# Patient Record
Sex: Male | Born: 2000 | Race: White | Hispanic: No | Marital: Single | Smoking: Never smoker
Health system: Southern US, Community
[De-identification: ages and names within clinical notes are randomized; demographics above are authoritative.]

## PROBLEM LIST (undated history)

## (undated) DIAGNOSIS — F419 Anxiety disorder, unspecified: Secondary | ICD-10-CM

## (undated) DIAGNOSIS — F902 Attention-deficit hyperactivity disorder, combined type: Secondary | ICD-10-CM

## (undated) DIAGNOSIS — R4689 Other symptoms and signs involving appearance and behavior: Secondary | ICD-10-CM

## (undated) DIAGNOSIS — F3481 Disruptive mood dysregulation disorder: Secondary | ICD-10-CM

## (undated) DIAGNOSIS — F4325 Adjustment disorder with mixed disturbance of emotions and conduct: Secondary | ICD-10-CM

## (undated) DIAGNOSIS — F840 Autistic disorder: Secondary | ICD-10-CM

## (undated) HISTORY — DX: Adjustment disorder with mixed disturbance of emotions and conduct: F43.25

## (undated) HISTORY — DX: Attention-deficit hyperactivity disorder, combined type: F90.2

## (undated) HISTORY — DX: Other symptoms and signs involving appearance and behavior: R46.89

---
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
## 2021-01-27 ENCOUNTER — Other Ambulatory Visit: Payer: Self-pay

## 2021-01-27 ENCOUNTER — Ambulatory Visit (HOSPITAL_COMMUNITY)
Admission: EM | Admit: 2021-01-27 | Discharge: 2021-01-27 | Disposition: A | Discharge status: Home / Self Care | Payer: No Typology Code available for payment source | Attending: Urology | Admitting: Urology

## 2021-01-27 DIAGNOSIS — F840 Autistic disorder: Secondary | ICD-10-CM | POA: Insufficient documentation

## 2021-01-27 DIAGNOSIS — Z6222 Institutional upbringing: Secondary | ICD-10-CM | POA: Insufficient documentation

## 2021-01-27 DIAGNOSIS — Z733 Stress, not elsewhere classified: Secondary | ICD-10-CM | POA: Insufficient documentation

## 2021-01-27 DIAGNOSIS — F909 Attention-deficit hyperactivity disorder, unspecified type: Secondary | ICD-10-CM | POA: Insufficient documentation
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
## 2021-01-27 NOTE — Discharge Instructions (Addendum)
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
## 2021-01-31 ENCOUNTER — Other Ambulatory Visit: Payer: Self-pay

## 2021-01-31 MED ORDER — CETIRIZINE HCL 10 MG PO TABS: 10.0000 mg | ORAL_TABLET | Freq: Every day | ORAL | 5 refills | Status: DC

## 2021-03-06 ENCOUNTER — Ambulatory Visit (HOSPITAL_COMMUNITY)
Admission: EM | Admit: 2021-03-06 | Discharge: 2021-03-06 | Disposition: A | Discharge status: Home / Self Care | Payer: Medicaid Other | Attending: Physician Assistant | Admitting: Physician Assistant

## 2021-03-06 ENCOUNTER — Encounter (HOSPITAL_COMMUNITY): Payer: Self-pay | Admitting: Emergency Medicine

## 2021-03-06 DIAGNOSIS — W503XXA Accidental bite by another person, initial encounter: Secondary | ICD-10-CM

## 2021-03-06 DIAGNOSIS — S0081XA Abrasion of other part of head, initial encounter: Secondary | ICD-10-CM

## 2021-03-06 HISTORY — DX: Disruptive mood dysregulation disorder: F34.81

## 2021-03-06 HISTORY — DX: Anxiety disorder, unspecified: F41.9

## 2021-03-06 HISTORY — DX: Autistic disorder: F84.0

## 2021-03-06 MED ORDER — DOXYCYCLINE HYCLATE 100 MG PO CAPS: 100.0000 mg | ORAL_CAPSULE | Freq: Two times a day (BID) | ORAL | 0 refills | Status: DC
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
## 2021-03-06 NOTE — Discharge Instructions (Addendum)
Continue to keep wound clean with soap and water. If redness, swelling worsens, or he develops fever, nausea or vomiting recommend evaluation in the ED.
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
## 2021-03-07 ENCOUNTER — Other Ambulatory Visit: Payer: Self-pay | Admitting: Family Medicine

## 2021-03-08 ENCOUNTER — Other Ambulatory Visit: Payer: Self-pay | Admitting: *Deleted

## 2021-03-08 MED ORDER — FLUTICASONE PROPIONATE 50 MCG/ACT NA SUSP: 2.0000 | Freq: Every day | NASAL | 3 refills | Status: DC

## 2021-05-03 ENCOUNTER — Other Ambulatory Visit: Payer: Self-pay | Admitting: *Deleted

## 2021-05-04 MED ORDER — VITAMIN D3 25 MCG PO TABS: 1000.0000 [IU] | ORAL_TABLET | Freq: Every day | ORAL | 0 refills | Status: DC

## 2021-05-14 ENCOUNTER — Other Ambulatory Visit: Payer: Self-pay | Admitting: Family Medicine

## 2021-06-03 ENCOUNTER — Other Ambulatory Visit: Payer: Self-pay | Admitting: *Deleted

## 2021-06-03 MED ORDER — VITAMIN D3 25 MCG PO TABS: 1000.0000 [IU] | ORAL_TABLET | Freq: Every day | ORAL | 3 refills | Status: DC

## 2021-07-02 ENCOUNTER — Other Ambulatory Visit: Payer: Self-pay | Admitting: *Deleted

## 2021-07-02 MED ORDER — FLUTICASONE PROPIONATE 50 MCG/ACT NA SUSP: 2.0000 | Freq: Every day | NASAL | 3 refills | Status: DC

## 2021-07-02 MED ORDER — MELATONIN 3 MG PO TABS: 3.0000 mg | ORAL_TABLET | Freq: Every day | ORAL | 2 refills | Status: DC

## 2021-08-01 ENCOUNTER — Other Ambulatory Visit: Payer: Self-pay

## 2021-08-01 MED ORDER — DOCUSATE SODIUM 100 MG PO CAPS: 100.0000 mg | ORAL_CAPSULE | Freq: Every day | ORAL | 0 refills | Status: DC

## 2021-08-01 MED ORDER — CETIRIZINE HCL 10 MG PO TABS: 10.0000 mg | ORAL_TABLET | Freq: Every day | ORAL | 1 refills | Status: DC

## 2021-08-03 ENCOUNTER — Other Ambulatory Visit: Payer: Self-pay

## 2021-08-03 ENCOUNTER — Emergency Department (HOSPITAL_COMMUNITY)
Admission: EM | Admit: 2021-08-03 | Discharge: 2021-08-04 | Disposition: A | Discharge status: Home / Self Care | Payer: BC Managed Care – PPO | Attending: Emergency Medicine | Admitting: Emergency Medicine

## 2021-08-03 DIAGNOSIS — R454 Irritability and anger: Secondary | ICD-10-CM | POA: Insufficient documentation

## 2021-08-03 DIAGNOSIS — Z046 Encounter for general psychiatric examination, requested by authority: Secondary | ICD-10-CM | POA: Diagnosis present

## 2021-08-03 DIAGNOSIS — Z20822 Contact with and (suspected) exposure to covid-19: Secondary | ICD-10-CM | POA: Diagnosis not present

## 2021-08-03 DIAGNOSIS — R451 Restlessness and agitation: Secondary | ICD-10-CM

## 2021-08-03 DIAGNOSIS — R4689 Other symptoms and signs involving appearance and behavior: Secondary | ICD-10-CM

## 2021-08-03 DIAGNOSIS — R531 Weakness: Secondary | ICD-10-CM | POA: Insufficient documentation

## 2021-08-03 DIAGNOSIS — Z5989 Other problems related to housing and economic circumstances: Secondary | ICD-10-CM | POA: Insufficient documentation

## 2021-08-03 DIAGNOSIS — G3184 Mild cognitive impairment, so stated: Secondary | ICD-10-CM | POA: Insufficient documentation

## 2021-08-03 DIAGNOSIS — F840 Autistic disorder: Secondary | ICD-10-CM

## 2021-08-03 DIAGNOSIS — F918 Other conduct disorders: Secondary | ICD-10-CM | POA: Diagnosis not present

## 2021-08-03 DIAGNOSIS — F29 Unspecified psychosis not due to a substance or known physiological condition: Secondary | ICD-10-CM | POA: Diagnosis not present

## 2021-08-03 DIAGNOSIS — F909 Attention-deficit hyperactivity disorder, unspecified type: Secondary | ICD-10-CM | POA: Diagnosis not present

## 2021-08-03 DIAGNOSIS — Z79899 Other long term (current) drug therapy: Secondary | ICD-10-CM | POA: Insufficient documentation

## 2021-08-03 DIAGNOSIS — F4325 Adjustment disorder with mixed disturbance of emotions and conduct: Secondary | ICD-10-CM | POA: Insufficient documentation

## 2021-08-03 DIAGNOSIS — F902 Attention-deficit hyperactivity disorder, combined type: Secondary | ICD-10-CM | POA: Insufficient documentation

## 2021-08-03 DIAGNOSIS — R441 Visual hallucinations: Secondary | ICD-10-CM | POA: Insufficient documentation

## 2021-08-03 LAB — COMPREHENSIVE METABOLIC PANEL
ALT: 50 U/L — ABNORMAL HIGH (ref 0–44)
AST: 27 U/L (ref 15–41)
Albumin: 4.7 g/dL (ref 3.5–5.0)
Alkaline Phosphatase: 77 U/L (ref 38–126)
Anion gap: 9 (ref 5–15)
BUN: 18 mg/dL (ref 6–20)
CO2: 23 mmol/L (ref 22–32)
Calcium: 9.4 mg/dL (ref 8.9–10.3)
Chloride: 103 mmol/L (ref 98–111)
Creatinine, Ser: 0.95 mg/dL (ref 0.61–1.24)
GFR, Estimated: >60 mL/min (ref >60)
Glucose, Bld: 91 mg/dL (ref 70–99)
Potassium: 3.9 mmol/L (ref 3.5–5.1)
Sodium: 135 mmol/L (ref 135–145)
Total Bilirubin: 1.1 mg/dL (ref 0.3–1.2)
Total Protein: 7.9 g/dL (ref 6.5–8.1)

## 2021-08-03 LAB — RAPID URINE DRUG SCREEN, HOSP PERFORMED
Amphetamines: NOT DETECTED
Barbiturates: NOT DETECTED
Benzodiazepines: NOT DETECTED
Cocaine: NOT DETECTED
Opiates: NOT DETECTED
Tetrahydrocannabinol: NOT DETECTED

## 2021-08-03 LAB — CBC WITH DIFFERENTIAL/PLATELET
Abs Immature Granulocytes: 0.02 10*3/uL (ref 0.00–0.07)
Basophils Absolute: 0 10*3/uL (ref 0.0–0.1)
Basophils Relative: 1 %
Eosinophils Absolute: 0.1 10*3/uL (ref 0.0–0.5)
Eosinophils Relative: 2 %
HCT: 55.5 % — ABNORMAL HIGH (ref 39.0–52.0)
Hemoglobin: 19 g/dL — ABNORMAL HIGH (ref 13.0–17.0)
Immature Granulocytes: 0 %
Lymphocytes Relative: 33 %
Lymphs Abs: 2.2 10*3/uL (ref 0.7–4.0)
MCH: 30 pg (ref 26.0–34.0)
MCHC: 34.2 g/dL (ref 30.0–36.0)
MCV: 87.7 fL (ref 80.0–100.0)
Monocytes Absolute: 0.6 10*3/uL (ref 0.1–1.0)
Monocytes Relative: 9 %
Neutro Abs: 3.7 10*3/uL (ref 1.7–7.7)
Neutrophils Relative %: 55 %
Platelets: 235 10*3/uL (ref 150–400)
RBC: 6.33 MIL/uL — ABNORMAL HIGH (ref 4.22–5.81)
RDW: 12.9 % (ref 11.5–15.5)
WBC: 6.6 10*3/uL (ref 4.0–10.5)
nRBC: 0 % (ref 0.0–0.2)

## 2021-08-03 LAB — ACETAMINOPHEN LEVEL: Acetaminophen (Tylenol), Serum: <10 ug/mL — ABNORMAL LOW (ref 10–30)

## 2021-08-03 LAB — SALICYLATE LEVEL: Salicylate Lvl: <7 mg/dL — ABNORMAL LOW (ref 7.0–30.0)

## 2021-08-03 LAB — ETHANOL: Alcohol, Ethyl (B): <10 mg/dL (ref <10)

## 2021-08-03 MED ORDER — CHLORPROMAZINE HCL 25 MG PO TABS: 25.0000 mg | ORAL_TABLET | Freq: Three times a day (TID) | ORAL | Status: DC

## 2021-08-03 MED ORDER — STERILE WATER FOR INJECTION IJ SOLN
INTRAMUSCULAR | Status: AC
  Administered 2021-08-03: 1.2 mL
  Filled 2021-08-03: qty 10

## 2021-08-03 MED ORDER — ATOMOXETINE HCL 40 MG PO CAPS
40.0000 mg | ORAL_CAPSULE | Freq: Every day | ORAL | Status: DC
  Filled 2021-08-03: qty 1

## 2021-08-03 MED ORDER — MELATONIN 3 MG PO TABS: 3.0000 mg | ORAL_TABLET | Freq: Every day | ORAL | Status: DC

## 2021-08-03 MED ORDER — ONDANSETRON 4 MG PO TBDP
ORAL_TABLET | ORAL | Status: AC
  Filled 2021-08-03: qty 1

## 2021-08-03 MED ORDER — LORAZEPAM 2 MG/ML IJ SOLN
2.0000 mg | Freq: Once | INTRAMUSCULAR | Status: AC
  Administered 2021-08-03: 2 mg via INTRAVENOUS
  Filled 2021-08-03: qty 1

## 2021-08-03 MED ORDER — HYDROXYZINE HCL 50 MG/ML IM SOLN
100.0000 mg | Freq: Four times a day (QID) | INTRAMUSCULAR | Status: DC | PRN
  Administered 2021-08-03: 100 mg via INTRAMUSCULAR
  Filled 2021-08-03: qty 2

## 2021-08-03 MED ORDER — ONDANSETRON 4 MG PO TBDP
4.0000 mg | ORAL_TABLET | Freq: Once | ORAL | Status: AC
  Administered 2021-08-03: 4 mg via ORAL

## 2021-08-03 MED ORDER — BUSPIRONE HCL 10 MG PO TABS: 15.0000 mg | ORAL_TABLET | Freq: Three times a day (TID) | ORAL | Status: DC

## 2021-08-03 MED ORDER — VITAMIN D3 25 MCG PO TABS
1000.0000 [IU] | ORAL_TABLET | Freq: Every day | ORAL | Status: DC
  Filled 2021-08-03: qty 1

## 2021-08-03 MED ORDER — KETAMINE HCL 50 MG/ML IJ SOLN
300.0000 mg | Freq: Once | INTRAMUSCULAR | Status: AC
  Administered 2021-08-03: 300 mg via INTRAMUSCULAR
  Filled 2021-08-03: qty 10

## 2021-08-03 MED ORDER — LORATADINE 10 MG PO TABS: 10.0000 mg | ORAL_TABLET | Freq: Every day | ORAL | Status: DC

## 2021-08-03 MED ORDER — ZIPRASIDONE MESYLATE 20 MG IM SOLR: 20.0000 mg | Freq: Once | INTRAMUSCULAR | Status: AC

## 2021-08-03 MED ORDER — ZIPRASIDONE MESYLATE 20 MG IM SOLR
INTRAMUSCULAR | Status: AC
  Administered 2021-08-03: 20 mg via INTRAMUSCULAR
  Filled 2021-08-03: qty 20

## 2021-08-03 NOTE — ED Notes (Signed)
Pt placed in room, sitter present, placed on monitor

## 2021-08-03 NOTE — ED Provider Notes (Signed)
[facility] DEPT Provider Note   CSN: [id number] Arrival date & time: 08/03/21  0857     History Chief Complaint  Patient presents with   Mental Health Problem    Ray Carter is a 21 y.o. male with hx of ADHD and autism who presents to the emergency department under police IVC order secondary to destruction of property at his group home.  Patient states that he wanted to destroyed the property and started ripping out the metal fence and was trying to rip off cameras off the house.  When cops arrived, patient also asked if he could destroy their vehicles as well.  He denies any suicidal homicidal ideations.  No auditory or visual hallucinations.   Mental Health Problem     Home Medications Prior to Admission medications   Medication Sig Start Date End Date Taking? Authorizing Provider  atomoxetine (STRATTERA) 40 MG capsule Take 40 mg by mouth daily.    [provider]  busPIRone (BUSPAR) 10 MG tablet Take 15 mg by mouth 3 (three) times daily.    [provider]  cetirizine (ZYRTEC) 10 MG tablet Take 1 tablet (10 mg total) by mouth daily. Please make appointment for further refills. 08/01/21   Wells Guiles, DO  chlorproMAZINE (THORAZINE) 25 MG tablet Take 25 mg by mouth 3 (three) times daily. Take 25mg  8am and 2pm, 75mg  at 8pm.    [provider]  docusate sodium (STOOL SOFTENER) 100 MG capsule Take 1 capsule (100 mg total) by mouth daily. 08/01/21   Wells Guiles, DO  doxycycline (VIBRAMYCIN) 100 MG capsule Take 1 capsule (100 mg total) by mouth 2 (two) times daily. 03/06/21   Francene Finders, PA-C  fluticasone (FLONASE) 50 MCG/ACT nasal spray Place 2 sprays into both nostrils daily. 07/02/21   Wells Guiles, DO  fluvoxaMINE (LUVOX) 100 MG tablet Take 100 mg by mouth 2 (two) times daily.    [provider]  melatonin 3 MG TABS tablet Take 1 tablet (3 mg total) by mouth at bedtime. 07/02/21   Wells Guiles, DO  Vitamin D3  (VITAMIN D) 25 MCG tablet Take 1 tablet (1,000 Units total) by mouth daily. 06/03/21   Wells Guiles, DO      Allergies    Patient has no known allergies.    Review of Systems   Review of Systems  All other systems reviewed and are negative.  Physical Exam Updated Vital Signs There were no vitals taken for this visit. Physical Exam Vitals and nursing note reviewed.  Constitutional:      General: He is not in acute distress.    Appearance: Normal appearance.  HENT:     Head: Normocephalic and atraumatic.  Eyes:     General:        Right eye: No discharge.        Left eye: No discharge.  Cardiovascular:     Comments: Regular rate and rhythm.  S1/S2 are distinct without any evidence of murmur, rubs, or gallops.  Radial pulses are 2+ bilaterally.  Dorsalis pedis pulses are 2+ bilaterally.  No evidence of pedal edema. Pulmonary:     Comments: Clear to auscultation bilaterally.  Normal effort.  No respiratory distress.  No evidence of wheezes, rales, or rhonchi heard throughout. Abdominal:     General: Abdomen is flat. Bowel sounds are normal. There is no distension.     Tenderness: There is no abdominal tenderness. There is no guarding or rebound.  Musculoskeletal:  General: Normal range of motion.     Cervical back: Neck supple.  Skin:    General: Skin is warm and dry.     Findings: No rash.  Neurological:     General: No focal deficit present.     Mental Status: He is alert.  Psychiatric:        Mood and Affect: Mood normal.        Behavior: Behavior normal.    ED Results / Procedures / Treatments   Labs (all labs ordered are listed, but only abnormal results are displayed) Labs Reviewed - No data to display  EKG None  Radiology No results found.  Procedures Procedures    Medications Ordered in ED Medications - No data to display  ED Course/ Medical Decision Making/ A&P                           Medical Decision Making Amount and/or Complexity of  Data Reviewed Labs: ordered.   Ray Carter is a 21 y.o. male with significant morbidities to impact his care today to include ADHD and autism who presents to the emergency department today under IVC order secondary destruction of property.  Given the history I do believe he would benefit from psychiatric evaluation today.  He is in no acute distress and has no real somatic complaints.  Appropriate paperwork was obtained and correctly documented.  I will get medical screening labs to clear him and plan to consult TTS.  I personally reviewed the labs.  CBC is without any evidence of leukocytosis.  CMP was normal.  Tylenol and salicylate levels were normal.  Urine drug screen is pending.  Respiratory panel is pending.  Patient is medically cleared from my standpoint.  I will place patient psychiatric hold and put a call for TTS for further recommendations.  Disposition to be made by oncoming provider with TTS recommendations.  Final Clinical Impression(s) / ED Diagnoses Final diagnoses:  None    Rx / DC Orders ED Discharge Orders     None         Hendricks Limes, [place] 08/03/21 1503    Valarie Merino, MD 08/05/21 [phone]

## 2021-08-03 NOTE — ED Triage Notes (Signed)
Pt arrives via [employer] from group home with reports of destructive behavior.
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
## 2021-08-03 NOTE — ED Notes (Signed)
Pt pacing in hallway, stating he his a "White supremacist", threatening to walk out and saying "fuck EMS"; attempt made to redirect pt to bed
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
## 2021-08-03 NOTE — ED Notes (Addendum)
This writer told pt to have a seat. Pt not cooperating. Pt insisting to get his flag and the rest of his belongings. Pt states "when I see them (security) I'm going to run". This Software engineer.

## 2021-08-03 NOTE — ED Notes (Addendum)
Pt continues to pace, threatening to walk out and curse in hallway; multiple attempts made by staff to redirect pt, pt continues to threaten to walk out and is argumentative

## 2021-08-03 NOTE — BH Assessment (Addendum)
Disposition:   Per Oneida Alar, NP pt does not meet inpatient criteria and can be discharged back to group home.   @ 2256, Clinician attempted to make contact with group home due to staff/TOC not being able to contact group home.   First, called [employer] (group home starff) [phone], no answer, phone rung multiple times, no option to leave a voicemail.   Second, called Lovette Cliche (group home owner) [phone], he answered and was informed that patient was psych cleared earlier today and ready to be picked up from the ED. Patient to follow up with current outpatient provider [facility]).   Quillian Quince stated, no staff were available to pick patient up tonight from the ED. He says,  "All my staff are sleep and it's no one to pick him up, staff have to sleep". Clinician reminded the group home owner that that patient was psych cleared earlier today and it's no longer reason to continue holding him in the Emergency Department. Disposition Counselor attempted to seek another resolution with the group home owner such as him having another staff member pick patient up from the ED. Group Home owner refused, stating, "No, No, My staff are sleep, it's not additional staff, and I don't want to wake my current staff up". The group home owner refused to take patient back into the group home until morning.   Quillian Quince states, "It's best to have [employer] drop him off at the group home in the morning @ 7:30am". Clinician asked if their were any  group home staff available in the morning to assume care. Quillian Quince stated, "Yes, her name is  Mrs. Laquita Humphrey # [id number] [id number]". Disposition Counselor reviewed the plan with the group home owner and that his request for [employer] transport would be relayed to the Emergency Department staff.   Charge nurse reached out to this Disposition Counselor regarding transportation back to the group home. Disposition Counselor suggested for TOC to be contacted to assist in  coordinating patient's return back to his group home, as patient is psych cleared.  Quillian Quince confirmed that patient is his own legal guardian.Therefore, their is no guardian to contact in this case.   Patient's nurse Candelaria Stagers, RN) and EDP (Dr. Eulis Foster) provided disposition updates.

## 2021-08-03 NOTE — ED Notes (Signed)
Pt has been seen and wand by security.  Two bags in cabinet label C.

## 2021-08-03 NOTE — ED Notes (Signed)
Pt placed in soft restraints in an attempt to avoid hard restraints

## 2021-08-03 NOTE — ED Notes (Addendum)
Pt attempts to walk off unit, security called, EDP and charge RN aware

## 2021-08-03 NOTE — TOC Initial Note (Signed)
Transition of Care [facility]) - Initial/Assessment Note    Patient Details  Name: Ray Carter MRN: [id number] Date of Birth: May 23, 2001  Transition of [facility]) CM/SW Contact:    Lockie Pares, RN Phone Number: 08/03/2021, 6:31 PM  Clinical Narrative:                  TOC consulted due to not being able to contact group home. Called first number [facility], no answer, no message could be left. [phone] Called second number, Alonza Smoker [phone], no answer left message for him to return my call ASAP.        Patient Goals and CMS Choice        Expected Discharge Plan and Services                                                Prior Living Arrangements/Services                       Activities of Daily Living      Permission Sought/Granted                  Emotional Assessment              Admission diagnosis:  MEntal Health Eval Patient Active Problem List   Diagnosis Date Noted   Attention deficit hyperactivity disorder (ADHD), combined type    Adjustment disorder with mixed disturbance of emotions and conduct    Overweight 07/15/2020   Polycythemia 07/15/2020   Mild cognitive impairment 07/13/2020   Psychiatric disorder 07/13/2020   Hypersomnolence 12/13/2019   PCP:  Shelby Mattocks, DO Pharmacy:   [facility] - [place], [place] - [address] [place] [address] Phone: [phone] Fax: [phone]     Social Determinants of Health (SDOH) Interventions    Readmission Risk Interventions No flowsheet data found.

## 2021-08-03 NOTE — ED Notes (Signed)
Pt eating at this time.

## 2021-08-03 NOTE — ED Provider Notes (Signed)
He has persistent agitation, despite multiple doses of sedating medication, requiring frequent and firm restraint, by multiple providers including restraint devices.  He is unsafe and a danger to himself and others.  So far this evening he has had Geodon, Ativan, and hydroxyzine all IM.  Will give additional IM dose of ketamine 300 mg.  Patient will require monitoring.  10:40 PM-patient now calm, not requiring security at bedside.  He is responsive and complains of sleepiness.  Asked if he wanted to go to sleep and he stated "no."  Vital signs are stable.  Oxygenation is normal.  Heart rate 85, sinus, on cardiac monitor.  Pulse oxygenation 95% on room air.  Will continue to observe.  Note that legal guardian and group home had not responded to calls to retrieve patient.  TOC has been consulted.  No resolution for disposition, yet.   Daleen Bo, MD 08/03/21 2245

## 2021-08-03 NOTE — ED Notes (Signed)
Pt transferred from [place] [facility] to [place] [facility]. Pt's belonging's bags remain behind nursing station 9-12.

## 2021-08-03 NOTE — ED Notes (Signed)
Security called

## 2021-08-04 ENCOUNTER — Ambulatory Visit (HOSPITAL_COMMUNITY)
Admission: EM | Admit: 2021-08-04 | Discharge: 2021-08-04 | Disposition: A | Discharge status: Home / Self Care | Payer: BC Managed Care – PPO | Source: Home / Self Care

## 2021-08-04 ENCOUNTER — Other Ambulatory Visit: Payer: Self-pay

## 2021-08-04 ENCOUNTER — Emergency Department (HOSPITAL_COMMUNITY)
Admission: EM | Admit: 2021-08-04 | Discharge: 2021-08-05 | Disposition: A | Discharge status: Home / Self Care | Payer: BC Managed Care – PPO | Source: Home / Self Care | Attending: Emergency Medicine | Admitting: Emergency Medicine

## 2021-08-04 DIAGNOSIS — F918 Other conduct disorders: Secondary | ICD-10-CM | POA: Insufficient documentation

## 2021-08-04 DIAGNOSIS — R531 Weakness: Secondary | ICD-10-CM | POA: Insufficient documentation

## 2021-08-04 DIAGNOSIS — F4329 Adjustment disorder with other symptoms: Secondary | ICD-10-CM | POA: Insufficient documentation

## 2021-08-04 DIAGNOSIS — F840 Autistic disorder: Secondary | ICD-10-CM | POA: Insufficient documentation

## 2021-08-04 DIAGNOSIS — F29 Unspecified psychosis not due to a substance or known physiological condition: Secondary | ICD-10-CM | POA: Insufficient documentation

## 2021-08-04 DIAGNOSIS — F909 Attention-deficit hyperactivity disorder, unspecified type: Secondary | ICD-10-CM | POA: Insufficient documentation

## 2021-08-04 DIAGNOSIS — R441 Visual hallucinations: Secondary | ICD-10-CM | POA: Insufficient documentation

## 2021-08-04 DIAGNOSIS — Z5989 Other problems related to housing and economic circumstances: Secondary | ICD-10-CM | POA: Insufficient documentation

## 2021-08-04 DIAGNOSIS — R4689 Other symptoms and signs involving appearance and behavior: Secondary | ICD-10-CM

## 2021-08-04 DIAGNOSIS — G3184 Mild cognitive impairment, so stated: Secondary | ICD-10-CM | POA: Insufficient documentation

## 2021-08-04 DIAGNOSIS — R454 Irritability and anger: Secondary | ICD-10-CM | POA: Insufficient documentation

## 2021-08-04 DIAGNOSIS — Z20822 Contact with and (suspected) exposure to covid-19: Secondary | ICD-10-CM | POA: Insufficient documentation

## 2021-08-04 DIAGNOSIS — F4325 Adjustment disorder with mixed disturbance of emotions and conduct: Secondary | ICD-10-CM | POA: Insufficient documentation

## 2021-08-04 DIAGNOSIS — Z7689 Persons encountering health services in other specified circumstances: Secondary | ICD-10-CM

## 2021-08-04 LAB — RESP PANEL BY RT-PCR (FLU A&B, COVID) ARPGX2
Influenza A by PCR: NEGATIVE
Influenza B by PCR: NEGATIVE
SARS Coronavirus 2 by RT PCR: NEGATIVE

## 2021-08-04 MED ORDER — ZIPRASIDONE MESYLATE 20 MG IM SOLR
20.0000 mg | INTRAMUSCULAR | Status: AC | PRN
  Administered 2021-08-04: 20 mg via INTRAMUSCULAR
  Filled 2021-08-04: qty 20

## 2021-08-04 MED ORDER — DOCUSATE SODIUM 100 MG PO CAPS
100.0000 mg | ORAL_CAPSULE | Freq: Every day | ORAL | Status: DC
  Administered 2021-08-05: 100 mg via ORAL
  Filled 2021-08-04: qty 1

## 2021-08-04 MED ORDER — LORAZEPAM 1 MG PO TABS
1.0000 mg | ORAL_TABLET | ORAL | Status: AC | PRN
  Administered 2021-08-04: 1 mg via ORAL
  Filled 2021-08-04: qty 1

## 2021-08-04 MED ORDER — CHLORPROMAZINE HCL 25 MG PO TABS: 25.0000 mg | ORAL_TABLET | Freq: Three times a day (TID) | ORAL | Status: DC

## 2021-08-04 MED ORDER — BUSPIRONE HCL 10 MG PO TABS
15.0000 mg | ORAL_TABLET | Freq: Three times a day (TID) | ORAL | Status: DC
  Administered 2021-08-04 – 2021-08-05 (×3): 15 mg via ORAL
  Filled 2021-08-04 (×3): qty 2

## 2021-08-04 MED ORDER — MELATONIN 3 MG PO TABS
3.0000 mg | ORAL_TABLET | Freq: Every day | ORAL | Status: DC
  Administered 2021-08-04: 3 mg via ORAL
  Filled 2021-08-04: qty 1

## 2021-08-04 MED ORDER — CHLORPROMAZINE HCL 25 MG PO TABS
50.0000 mg | ORAL_TABLET | Freq: Every day | ORAL | Status: DC
  Administered 2021-08-04: 50 mg via ORAL
  Filled 2021-08-04: qty 2

## 2021-08-04 MED ORDER — ATOMOXETINE HCL 40 MG PO CAPS
40.0000 mg | ORAL_CAPSULE | Freq: Every day | ORAL | Status: DC
  Administered 2021-08-05: 40 mg via ORAL
  Filled 2021-08-04: qty 1

## 2021-08-04 MED ORDER — FLUVOXAMINE MALEATE 50 MG PO TABS
100.0000 mg | ORAL_TABLET | Freq: Two times a day (BID) | ORAL | Status: DC
  Administered 2021-08-04 – 2021-08-05 (×2): 100 mg via ORAL
  Filled 2021-08-04 (×3): qty 2

## 2021-08-04 MED ORDER — OLANZAPINE 10 MG PO TBDP
10.0000 mg | ORAL_TABLET | Freq: Three times a day (TID) | ORAL | Status: DC | PRN
  Administered 2021-08-04 – 2021-08-05 (×2): 10 mg via ORAL
  Filled 2021-08-04 (×2): qty 1

## 2021-08-04 MED ORDER — CHLORPROMAZINE HCL 25 MG PO TABS
25.0000 mg | ORAL_TABLET | ORAL | Status: DC
  Administered 2021-08-05 (×2): 25 mg via ORAL
  Filled 2021-08-04 (×2): qty 1

## 2021-08-04 MED ORDER — STERILE WATER FOR INJECTION IJ SOLN
INTRAMUSCULAR | Status: AC
  Administered 2021-08-04: 1.2 mL via INTRAMUSCULAR
  Filled 2021-08-04: qty 10

## 2021-08-04 MED ORDER — CHLORPROMAZINE HCL 25 MG PO TABS: 75.0000 mg | ORAL_TABLET | ORAL | Status: DC

## 2021-08-04 NOTE — ED Notes (Signed)
Patient screaming "Please get the fu*king restraints or I am going to be aggressive. Im not going to be treated as a fu*king covid patient."

## 2021-08-04 NOTE — ED Notes (Signed)
Pt aggressive with staff and continues to try and leave the room. Pt attempting to destroy property. Dr. Fredderick Phenix notified. Order placed for violent restraints at this time.

## 2021-08-04 NOTE — ED Notes (Signed)
Pt has gotten out of soft restraints multiple times and is physically aggressive with staff, continues to throw his body around in the stretcher, pt cursing and yelling; staff has attempted to reason with pt to avoid restraint use, pt continues to be noncompliant; security at bedside, EDP aware, charge nurse aware, when attempting to reapply soft restraints pt attempts to claw at staff and be resistant; hard restraints applied at this time

## 2021-08-04 NOTE — ED Provider Notes (Signed)
Patient group home will take patient back at 730 this morning.  IVC was rescinded.  [employer] to accompany patient back to the group home.   Rolan Bucco, MD 08/04/21 [phone]

## 2021-08-04 NOTE — ED Provider Notes (Signed)
[facility]-EMERGENCY DEPT Provider Note   CSN: [id number] Arrival date & time: 08/04/21  2003     History  No chief complaint on file.   Muneeb Veras is a 21 y.o. male.  21 year old male who presents under IVC from law enforcement due to threatening behavior at group home.  Patient seen here yesterday and was cleared by psychiatry this morning.  Went to the behavioral urgent care center and that visit was reviewed and he was clear that there as well.  Went back to the group home where he threatened staff with bodily harm.  Please recall to serve an IVC warrant and patient threatened to stab him with a pen.  Patient with aggressive verbal interactions at this time and cannot get history from him      Home Medications Prior to Admission medications   Medication Sig Start Date End Date Taking? Authorizing Provider  atomoxetine (STRATTERA) 40 MG capsule Take 40 mg by mouth daily.    [provider]  busPIRone (BUSPAR) 10 MG tablet Take 15 mg by mouth 3 (three) times daily.    [provider]  busPIRone (BUSPAR) 15 MG tablet Take 15 mg by mouth 3 (three) times daily. 07/31/21   [provider]  cetirizine (ZYRTEC) 10 MG tablet Take 1 tablet (10 mg total) by mouth daily. Please make appointment for further refills. 08/01/21   Shelby Mattocks, DO  chlorproMAZINE (THORAZINE) 25 MG tablet Take 25-50 mg by mouth 3 (three) times daily. Take 25mg  8am and 2pm, 75mg  at 8pm.    [provider]  chlorproMAZINE (THORAZINE) 50 MG tablet Take 50 mg by mouth at bedtime. 07/31/21   [provider]  docusate sodium (STOOL SOFTENER) 100 MG capsule Take 1 capsule (100 mg total) by mouth daily. 08/01/21   09/28/21, DO  doxycycline (VIBRAMYCIN) 100 MG capsule Take 1 capsule (100 mg total) by mouth 2 (two) times daily. 03/06/21   Shelby Mattocks, PA-C  fluticasone (FLONASE) 50 MCG/ACT nasal spray Place 2 sprays into both nostrils daily. 07/02/21    Tomi Bamberger, DO  fluvoxaMINE (LUVOX) 100 MG tablet Take 100 mg by mouth 2 (two) times daily.    [provider]  hydrOXYzine (ATARAX) 25 MG tablet Take by mouth. 08/02/21   [provider]  melatonin 3 MG TABS tablet Take 1 tablet (3 mg total) by mouth at bedtime. 07/02/21   09/30/21, DO  Vitamin D3 (VITAMIN D) 25 MCG tablet Take 1 tablet (1,000 Units total) by mouth daily. 06/03/21   Shelby Mattocks, DO      Allergies    Patient has no known allergies.    Review of Systems   Review of Systems  Unable to perform ROS: Psychiatric disorder   Physical Exam Updated Vital Signs BP 138/90 (BP Location: Right Arm)    Pulse 91    Temp 98.2 F (36.8 C) (Oral)    Resp 18    Ht 1.88 m (6\' 2" )    Wt 108.9 kg    SpO2 99%    BMI 30.82 kg/m  Physical Exam Vitals and nursing note reviewed.  Constitutional:      General: He is not in acute distress.    Appearance: Normal appearance. He is well-developed. He is not toxic-appearing.  HENT:     Head: Normocephalic and atraumatic.  Eyes:     General: Lids are normal.     Conjunctiva/sclera: Conjunctivae normal.     Pupils: Pupils are equal,  round, and reactive to light.  Neck:     Thyroid: No thyroid mass.     Trachea: No tracheal deviation.  Cardiovascular:     Rate and Rhythm: Normal rate and regular rhythm.     Heart sounds: Normal heart sounds. No murmur heard.   No gallop.  Pulmonary:     Effort: Pulmonary effort is normal. No respiratory distress.     Breath sounds: Normal breath sounds. No stridor. No decreased breath sounds, wheezing, rhonchi or rales.  Abdominal:     General: There is no distension.     Palpations: Abdomen is soft.     Tenderness: There is no abdominal tenderness. There is no rebound.  Musculoskeletal:        General: No tenderness. Normal range of motion.     Cervical back: Normal range of motion and neck supple.  Skin:    General: Skin is warm and dry.     Findings: No abrasion or rash.   Neurological:     Mental Status: He is alert and oriented to person, place, and time. Mental status is at baseline.     GCS: GCS eye subscore is 4. GCS verbal subscore is 5. GCS motor subscore is 6.     Cranial Nerves: No cranial nerve deficit.     Sensory: No sensory deficit.     Motor: Motor function is intact.  Psychiatric:        Attention and Perception: Attention normal.        Mood and Affect: Affect is labile and angry.        Speech: Speech is rapid and pressured.        Behavior: Behavior is agitated, aggressive and hyperactive.        Thought Content: Thought content is delusional.    ED Results / Procedures / Treatments   Labs (all labs ordered are listed, but only abnormal results are displayed) Labs Reviewed  RESP PANEL BY RT-PCR (FLU A&B, COVID) [id number]    EKG None  Radiology No results found.  Procedures Procedures    Medications Ordered in ED Medications  OLANZapine zydis (ZYPREXA) disintegrating tablet 10 mg (has no administration in time range)    And  LORazepam (ATIVAN) tablet 1 mg (has no administration in time range)    And  ziprasidone (GEODON) injection 20 mg (has no administration in time range)    ED Course/ Medical Decision Making/ A&P                           Medical Decision Making Risk Prescription drug management.   Patient had normal blood work done yesterday.  Used verbal de-escalation with patient and he will take oral antipsychotics at this time.  Patient is under IVC and first exam completed.  Patient is medically cleared to be seen by psychiatry        Final Clinical Impression(s) / ED Diagnoses Final diagnoses:  None    Rx / DC Orders ED Discharge Orders     None         Lorre Nick, MD 08/04/21 2032

## 2021-08-04 NOTE — ED Notes (Signed)
[employer] called to transport patient at this time.

## 2021-08-04 NOTE — ED Notes (Signed)
Pt escorted out by police at this time.

## 2021-08-04 NOTE — ED Triage Notes (Signed)
Pt came from group home via [employer]. Police state pt was brandishing knife and log at group home staff and verbally threatening staff.

## 2021-08-04 NOTE — Discharge Instructions (Signed)

## 2021-08-04 NOTE — ED Notes (Signed)
Pt using racial slurs with multiple staff members. Pt instructed to not speak that way with staff.

## 2021-08-04 NOTE — BH Assessment (Signed)
Per RN, Pt is currently too somnolent to participate in TTS tele-assessment. Pt will be assessed when he is able to participate.   Pamalee Leyden, [facility], [facility] Triage Specialist [phone]

## 2021-08-04 NOTE — Discharge Summary (Signed)
Cristopher Estimable to be D/C'd Home per NP order.  An After Visit Summary was printed and given to the [facility] manager. Patient escorted out and D/C home via private auto.  Clois Dupes  08/04/2021 11:39 AM

## 2021-08-04 NOTE — ED Provider Notes (Signed)
Behavioral Health Urgent Care Medical Screening Exam  Patient Name: Ray Carter MRN: [id number] Date of Evaluation: 08/04/21 Diagnosis:  Final diagnoses:  Aggressive behavior   History of Present illness: Ray Carter is a 21 y.o. male who presents to the [facility] behavioral health urgent care accompanied by law enforcement for threatening to destroy the property at the group home if he returns there. 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  Patient was psychiatrically cleared on 08/03/2021 at [facility]. Today, patient was picked up from [facility] emergency department and transported back to the group home by [employer]. However, on the way back to the group home patient expressed to [employer] that if he returns back to the group home he will destroy the property. Patient was then brought to the [facility] for an evaluation.   Patient seen and evaluated face-to-face by this provider with the group home owners Luna Kitchens and Alonza Smoker present, chart reviewed and case discussed with Dr. Bronwen Betters. On evaluation, patient is alert and oriented x4. His thought process is linear, however he lacks judgement and shows poor insight. His speech is clear and coherent. His mood is irritable and affect is congruent.  Patient denies suicidal or homicidal ideations. He states that he does not want to hurt anyone at the group home. He states that if he goes back to the group home he will destroy the property because he does not want to stay there. He states that he wants to go to another group home or to a shelter. Mr. Alonza Smoker states that the patient is his own guardian. He states that the patient has been living at his group home for the past two years.   Patient denies auditory hallucinations. He endorses visual hallucinations, seeing figures. There is no objective evidence that the patient is currently  responding to internal or external stimuli.  This provider explained to the patient that we do not facilitate group home placement in this capacity. Mrs. [name] states that they can discharge the patient from their group home and facilitates services. Patient insists that he does not want to go back to the group home. Patient states that he wants to go to a shelter. Patient was provided with shelter resources in the area. Patient has established outpatient psychiatry with [facility] and therapy with Dr. Burna Mortimer [place] every Thursday.   Patient denies SI/HI/AVH. Patient is not psychotic. Patient does not meet [place] IVC criteria. Mrs. and  Mr. Burt Knack advise to seek legal action if the patient returns back the group home and destroys property.   psychiatric Specialty Exam  Presentation  General Appearance:Appropriate for Environment  Eye Contact:Fair  Speech:Clear and Coherent  Speech Volume:Normal  Handedness:Left   Mood and Affect  Mood:Irritable  Affect:Congruent   Thought Process  Thought Processes:Coherent; Goal Directed  Descriptions of Associations:Intact  Orientation:Full (Time, Place and Person)  Thought Content:WDL  Diagnosis of Schizophrenia or Schizoaffective disorder in past: No   Hallucinations:None  Ideas of Reference:None  Suicidal Thoughts:No  Homicidal Thoughts:No   Sensorium  Memory:Immediate Fair; Recent Fair; Remote Fair  Judgment:Poor  Insight:Shallow   Executive Functions  Concentration:Fair  Attention Span:Fair  Recall:Fair  Fund of Knowledge:Fair  Language:Fair   Psychomotor Activity  Psychomotor Activity:Normal   Assets  Assets:Communication Skills; Desire for Improvement; Financial Resources/Insurance; Housing; Leisure Time; Physical Health; Social Support   Sleep  Sleep:Fair   Physical Exam: Physical Exam Constitutional:  Appearance: Normal appearance.  HENT:     Head: Normocephalic.     Nose: Nose  normal.  Eyes:     Conjunctiva/sclera: Conjunctivae normal.  Neurological:     Mental Status: He is alert and oriented to person, place, and time.   Review of Systems  Constitutional: Negative.   HENT: Negative.    Eyes: Negative.   Respiratory: Negative.    Cardiovascular: Negative.   Gastrointestinal: Negative.   Genitourinary: Negative.   Musculoskeletal: Negative.   Skin: Negative.   Neurological: Negative.   Endo/Heme/Allergies: Negative.    Patient refused vital signs.   Musculoskeletal: Strength & Muscle Tone: within normal limits Gait & Station: normal Patient leans: N/A   [facility] MSE Discharge Disposition for Follow up and Recommendations:  Based on my evaluation the patient does not appear to have an emergency medical condition and can be discharged with resources and follow up care in outpatient services for Medication Management, Individual Therapy, and Group Therapy  Layla Barter, NP 08/04/2021, 11:34 AM

## 2021-08-04 NOTE — ED Notes (Signed)
Pt continues to try and leave the room and having to be directed back to his room.

## 2021-08-04 NOTE — Progress Notes (Signed)
°   08/04/21 1103  [place] (Walk-ins at [facility] only)  What Is the Reason for Your Visit/Call Today? 20-year-male present to [facility] transported by [employer]. Patient mentally cleared from [place] around 9:30am on this day 08/04/2021. Patient picked up from [facility]  by [employer] to be transported to the group home ([facility]). Patient made threatening statements to [employer] if he goes back to the group home he would be aggressive and destructive. Group Home owner [employer] and [employer] present during assessment. Patient denied suicidal/homicidal ideations and denied auditory/visual hallucinations.  How Long Has This Been Causing You Problems? <Week  Have You Recently Had Any Thoughts About Hurting Yourself? No  Are You Planning to Commit Suicide/Harm Yourself At This time? No  Have you Recently Had Thoughts About [place]? No  Are You Planning To Harm Someone At This Time? No  Are you currently experiencing any auditory, visual or other hallucinations? No  Have You Used Any Alcohol or Drugs in the Past 24 Hours? No  Do you have any current medical co-morbidities that require immediate attention? No  Clinician description of patient physical appearance/behavior: Patient is calm and cooperative throughout assessment and engaged well with clinican.  What Do You Feel Would Help You the Most Today? Treatment for Depression or other mood problem  If access to [facility] was not available, would you have sought care in the Emergency Department? Yes  Determination of Need Routine (7 days)  Options For Referral Group Home

## 2021-08-04 NOTE — ED Notes (Signed)
[employer] at bedside. Pt out of restraints at this time.

## 2021-08-04 NOTE — ED Notes (Signed)
Pt trying to leave the facility. Having to be redirected by security and this nurse.

## 2021-08-05 DIAGNOSIS — F4325 Adjustment disorder with mixed disturbance of emotions and conduct: Secondary | ICD-10-CM | POA: Diagnosis not present

## 2021-08-05 MED ORDER — ACETAMINOPHEN 325 MG PO TABS
650.0000 mg | ORAL_TABLET | Freq: Four times a day (QID) | ORAL | Status: DC | PRN
  Administered 2021-08-05: 650 mg via ORAL
  Filled 2021-08-05: qty 2

## 2021-08-05 NOTE — Consult Note (Signed)
[facility] Psych ED Discharge  08/05/2021 10:36 AM Ray Carter  MRN:  [id number]  Method of visit?: Face to Face   Principal Problem: Adjustment disorder with mixed disturbance of emotions and conduct Discharge Diagnoses: Principal Problem:   Adjustment disorder with mixed disturbance of emotions and conduct Active Problems:   Mild cognitive impairment   Subjective:  Today upon evaluation patient is appropriate and alert. He appears to be engaging well with staff and Probation officer. He acknowledges his weaknesses, and how he been able to identify strong coping skills and behavior techniques to help with him return home. He is adamant he does not want to return to his facility. Furthermore patient endorses property destruction if sent back to his current group home "because I hate it there."  He is compliant with his medications at this time.  Overall patient reports much improvement of symptoms at this time as evident by his interaction with team, decrease in depressive symptoms and anxiety. He states that he made statements to hurt people but has no true intentions. He also denies any legal charges, history of violence, aggression or homicidal intent towards others. He denies any suicidal thoughts, homicidal thoughts, and or auditory visual hallucinations.  He does not appear to be responding to internal stimuli.  He has had no urges to self-harm while in the ED.  He is able to contract for safety at this time.    HPI:  21 year old male who presents under IVC from law enforcement due to threatening behavior at group home.  Patient seen here yesterday and was cleared by psychiatry this morning.  Went to the behavioral urgent care center and that visit was reviewed and he was clear that there as well.  Went back to the group home where he threatened staff with bodily harm.  Please recall to serve an IVC warrant and patient threatened to stab him with a pen.  Patient with aggressive verbal interactions at this time  and cannot get history from him.   Total Time spent with patient: 30 minutes  Past Psychiatric History:   Past Medical History:  Past Medical History:  Diagnosis Date   Anxiety    Autism    Disruptive mood dysregulation disorder ([place])    No past surgical history on file. Family History:  Family History  Adopted: Yes   Family Psychiatric  History: Denies Social History:  Social History   Substance and Sexual Activity  Alcohol Use Never     Social History   Substance and Sexual Activity  Drug Use Never    Social History   Socioeconomic History   Marital status: Single    Spouse name: Not on file   Number of children: 0   Years of education: Not on file   Highest education level: Not on file  Occupational History   Not on file  Tobacco Use   Smoking status: Never   Smokeless tobacco: Never  Vaping Use   Vaping Use: Never used  Substance and Sexual Activity   Alcohol use: Never   Drug use: Never   Sexual activity: Not on file  Other Topics Concern   Not on file  Social History Narrative   Not on file   Social Determinants of Health   Financial Resource Strain: Not on file  Food Insecurity: Not on file  Transportation Needs: Not on file  Physical Activity: Not on file  Stress: Not on file  Social Connections: Not on file    Tobacco Cessation:  N/A,  patient does not currently use tobacco products  Current Medications: Current Facility-Administered Medications  Medication Dose Route Frequency Provider Last Rate Last Admin   atomoxetine (STRATTERA) capsule 40 mg  40 mg Oral Daily Lacretia Leigh, MD   40 mg at 08/05/21 1025   busPIRone (BUSPAR) tablet 15 mg  15 mg Oral TID Lacretia Leigh, MD   15 mg at 08/05/21 1025   chlorproMAZINE (THORAZINE) tablet 25 mg  25 mg Oral 2 times per day Minda Ditto, RPH   25 mg at 08/05/21 [id number]   And   chlorproMAZINE (THORAZINE) tablet 75 mg  75 mg Oral Q24H Green, Terri L, RPH       chlorproMAZINE (THORAZINE) tablet  25-50 mg  25-50 mg Oral TID Lacretia Leigh, MD       chlorproMAZINE (THORAZINE) tablet 50 mg  50 mg Oral QHS Lacretia Leigh, MD   50 mg at 08/04/21 2107   docusate sodium (COLACE) capsule 100 mg  100 mg Oral Daily Lacretia Leigh, MD   100 mg at 08/05/21 1024   fluvoxaMINE (LUVOX) tablet 100 mg  100 mg Oral BID Lacretia Leigh, MD   100 mg at 08/05/21 1026   melatonin tablet 3 mg  3 mg Oral QHS Lacretia Leigh, MD   3 mg at 08/04/21 2108   OLANZapine zydis (ZYPREXA) disintegrating tablet 10 mg  10 mg Oral Q8H PRN Lacretia Leigh, MD   10 mg at 08/04/21 2107   Current Outpatient Medications  Medication Sig Dispense Refill   atomoxetine (STRATTERA) 40 MG capsule Take 40 mg by mouth in the morning.     busPIRone (BUSPAR) 15 MG tablet Take 15 mg by mouth 3 (three) times daily.     cetirizine (ZYRTEC) 10 MG tablet Take 1 tablet (10 mg total) by mouth daily. Please make appointment for further refills. (Patient taking differently: Take 10 mg by mouth in the morning.) 30 tablet 1   chlorproMAZINE (THORAZINE) 25 MG tablet Take 25 mg by mouth in the morning and at bedtime.     chlorproMAZINE (THORAZINE) 50 MG tablet Take 50 mg by mouth at bedtime.     docusate sodium (STOOL SOFTENER) 100 MG capsule Take 1 capsule (100 mg total) by mouth daily. (Patient taking differently: Take 100 mg by mouth in the morning.) 30 capsule 0   fluticasone (FLONASE) 50 MCG/ACT nasal spray Place 2 sprays into both nostrils daily. (Patient taking differently: Place 2 sprays into both nostrils in the morning.) 16 g 3   fluvoxaMINE (LUVOX) 100 MG tablet Take 100 mg by mouth in the morning and at bedtime.     hydrOXYzine (ATARAX) 25 MG tablet Take 25-50 mg by mouth See admin instructions. Take 25 mg by mouth three times a day and an additional 50 mg at bedtime as needed for sleep     melatonin 3 MG TABS tablet Take 1 tablet (3 mg total) by mouth at bedtime. 30 tablet 2   Vitamin D3 (VITAMIN D) 25 MCG tablet Take 1 tablet (1,000 Units  total) by mouth daily. (Patient taking differently: Take 1,000 Units by mouth daily with breakfast.) 30 tablet 3   doxycycline (VIBRAMYCIN) 100 MG capsule Take 1 capsule (100 mg total) by mouth 2 (two) times daily. (Patient not taking: Reported on 08/04/2021) 20 capsule 0   PTA Medications: (Not in a hospital admission)   Musculoskeletal: Strength & Muscle Tone: within normal limits Gait & Station: normal Patient leans: N/A  Psychiatric Specialty Exam:  Presentation  General  Appearance: Appropriate for Environment; Casual  Eye Contact:Fair  Speech:Clear and Coherent; Normal Rate  Speech Volume:Normal  Handedness:Right   Mood and Affect  Mood:Anxious; Euthymic  Affect:Appropriate; Congruent   Thought Process  Thought Processes:Coherent; Linear  Descriptions of Associations:Intact  Orientation:Full (Time, Place and Person)  Thought Content:Logical  History of Schizophrenia/Schizoaffective disorder:No  Duration of Psychotic Symptoms:No data recorded Hallucinations:Hallucinations: None  Ideas of Reference:None  Suicidal Thoughts:Suicidal Thoughts: No  Homicidal Thoughts:Homicidal Thoughts: No   Sensorium  Memory:Immediate Fair; Recent Fair; Remote Fair  Judgment:Fair  Insight:Fair   Executive Functions  Concentration:Fair  Attention Span:Fair  [place]   Psychomotor Activity  Psychomotor Activity:Psychomotor Activity: Normal   Assets  Assets:Communication Skills; Desire for Improvement; Financial Resources/Insurance; Housing; Leisure Time; Physical Health   Sleep  Sleep:Sleep: Fair    Physical Exam: Physical Exam Vitals and nursing note reviewed.  Constitutional:      Appearance: Normal appearance. He is normal weight.  Skin:    General: Skin is warm.  Neurological:     General: No focal deficit present.     Mental Status: He is alert and oriented to person, place, and time. Mental  status is at baseline.  Psychiatric:        Mood and Affect: Mood normal.        Behavior: Behavior normal.        Thought Content: Thought content normal.   Review of Systems  Psychiatric/Behavioral:  Negative for depression, hallucinations, memory loss, substance abuse and suicidal ideas. The patient is not nervous/anxious and does not have insomnia.   All other systems reviewed and are negative. Blood pressure (!) 131/100, pulse (!) 103, temperature 98.2 F (36.8 C), temperature source Oral, resp. rate 18, height 6\' 2"  (1.88 m), weight 108.9 kg, SpO2 98 %. Body mass index is 30.82 kg/m.   Demographic Factors:  Male, Adolescent or young adult, Caucasian, Low socioeconomic status, and Living alone  Loss Factors: Decline in physical health and Financial problems/change in socioeconomic status  Historical Factors: Impulsivity  Risk Reduction Factors:   Sense of responsibility to family, Living with another person, especially a relative, Positive social support, Positive therapeutic relationship, and Positive coping skills or problem solving skills  Continued Clinical Symptoms:  Severe Anxiety and/or Agitation More than one psychiatric diagnosis Unstable or Poor Therapeutic Relationship  Cognitive Features That Contribute To Risk:  None    Suicide Risk:  Minimal: No identifiable suicidal ideation.  Patients presenting with no risk factors but with morbid ruminations; may be classified as minimal risk based on the severity of the depressive symptoms  Plan Of Care/Follow-up recommendations:  Activity:  Resume activity as directed Diet:  Routine diet as directed.  Tests:  ROutine tests as directed.  Other:  Continue current medications as directed.   Disposition: Psych cleared. Will rescind IVC at this time. Group home has notified of emergency mandate petition they intend to file. Will place [facility] consult for placement issues.   Suella Broad, FNP 08/05/2021, 10:36  AM

## 2021-08-05 NOTE — Progress Notes (Addendum)
CSW left voicemail for [employer]  requesting return call. CSW also attempted call to [facility], there was no answer and CSW unable to leave a VM.   Addendum 3:15pm: CSW has made multiple attempts to get in contact with pts contacts to no avail. CSW made call to communications to request a welfare check be completed. CSW will be updated if Lovette Cliche can be reached.   Addendum 4:25pm: CSW updated that pt does not have a legal guardian. Pt can make decision as to if he would like to go to the prior living facility or not. Pt however, also has a charge and cannot return to living facility. CSW updated ED staff that homelessness resource list would be faxed over for pt and if RN could provide it to pt in room.

## 2021-08-05 NOTE — ED Notes (Signed)
Pt has two personal belongings bags located behind the nurses station below the desk that's above the cabinets next to room 25. There is no room in the cabinets atm. 1 bags holds an [employer] and clothes, and 1 bag has his boots.

## 2021-08-05 NOTE — ED Provider Notes (Addendum)
Emergency Medicine Observation Re-evaluation Note  Willaim Douberly is a 21 y.o. male, seen on rounds today.  Pt initially presented to the ED for complaints of No chief complaint on file. Currently, the patient is resting in room.  Physical Exam  BP 138/90 (BP Location: Right Arm)    Pulse 91    Temp 98.2 F (36.8 C) (Oral)    Resp 18    Ht 6\' 2"  (1.88 m)    Wt 108.9 kg    SpO2 99%    BMI 30.82 kg/m  Physical Exam General: resting comfortably, NAD Lungs: normal WOB Psych: currently calm and resting  ED Course / MDM  EKG:   I have reviewed the labs performed to date as well as medications administered while in observation.  Recent changes in the last 24 hours include none.  Plan  Current plan is for DC, patient has been psych cleared and IVC has been rescinded.   Thoma Mitra is not under involuntary commitment.     Lorelle Gibbs, DO 08/05/21 [id number]    Lorelle Gibbs, DO 08/05/21 1215

## 2021-08-05 NOTE — ED Notes (Signed)
Pt eating breakfast. No complaints at this time

## 2021-08-05 NOTE — Discharge Instructions (Addendum)
For your mental health needs, you are advised to continue treatment with [facility]:       [facility]      [address]      [place], [place] [address]      [phone]   

## 2021-08-05 NOTE — BH Assessment (Addendum)
[place] Assessment Progress Note   Per Sheran Fava, NP , this pt does not require psychiatric hospitalization at this time.  Pt presents under IVC initiated by pt's group home staff and upheld by EDP Lacretia Leigh, MD, which has been rescinded by Hampton Abbot, MD.  Pt is psychiatrically cleared.  Discharge instructions advise pt to continue treatment at [facility].  At 11:35 I called Lovette Cliche [phone]) at pt's group home to notify him of disposition.  He informs me that they have initiated process for removing pt from the group home.  EDP Lavenia Atlas, DO, pt's nurse, Levada Dy and Iona Beard, LCSWA have been notified.  Jalene Mullet, MA Triage Specialist [phone]  Addendum:  At 15:32 this Probation officer spoke to [employer] [phone]) with Margorie John, pt's group home provider.  She reports that there is a warrant out for pt's arrest, and that they are pursuing emergency discharge.  He is not to return to their facility.  She notes pt is his own guardian.  Parties noted above have been notified, and additionally, EDP Wynona Dove, DO.  Jalene Mullet, [place] Coordinator [phone]

## 2021-09-04 ENCOUNTER — Telehealth (HOSPITAL_COMMUNITY): Payer: Self-pay | Admitting: Student

## 2021-09-04 NOTE — BH Assessment (Signed)
Care Management - [facility] Follow Up Discharges  ? ?Writer attempted to make contact with patient today and was unsuccessful.  Writer left a HIPPA compliant voice message.  ? ?Per chart review, patient will follow up with established provider from [facility].  ?

## 2021-10-02 ENCOUNTER — Other Ambulatory Visit: Payer: Self-pay

## 2021-10-02 MED ORDER — MELATONIN 3 MG PO TABS: 3.0000 mg | ORAL_TABLET | Freq: Every day | ORAL | 2 refills | Status: DC

## 2021-10-02 MED ORDER — CETIRIZINE HCL 10 MG PO TABS: 10.0000 mg | ORAL_TABLET | Freq: Every day | ORAL | 1 refills | Status: DC

## 2021-10-02 MED ORDER — DOCUSATE SODIUM 100 MG PO CAPS: 100.0000 mg | ORAL_CAPSULE | Freq: Every day | ORAL | 2 refills | Status: DC

## 2021-10-02 MED ORDER — VITAMIN D3 25 MCG PO TABS: 1000.0000 [IU] | ORAL_TABLET | Freq: Every day | ORAL | 3 refills | Status: DC

## 2021-10-28 NOTE — Progress Notes (Signed)
? ? ?  SUBJECTIVE:  ? ?Chief compliant/HPI: annual examination ? ?Ray Carter is a 21 y.o. who presents today for an annual exam.  ? ?Reviewed and updated history.  ? ?Pt voiced concern of brief sharp chest pain that he experiences sometimes. Will last mabye 10 seconds and sometimes occurs after he eats or randomly. The pain does not go anywhere else. It has been happening for more than a year. 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  ? ?Patient does have psychiatrist that manages his psychiatric medications: no appointment coming up. ? ?OBJECTIVE:  ?BP 117/78   Pulse 82   Ht 6\' 2"  (1.88 m)   Wt 238 lb 2 oz (108 kg)   SpO2 98%   BMI 30.57 kg/m?   ?General: Awake, alert and appropriately responsive in NAD ?Chest: CTAB, normal WOB. Good air movement bilaterally.   ?Heart: RRR, no murmur appreciated ?Abdomen: Soft, non-tender, non-distended. Normoactive bowel sounds.  ? ?ASSESSMENT/PLAN:  ? ?Chest pain of uncertain etiology ?Chronic, brief sharp chest pain lasting ~10s. No pattern of frequency, sometimes occurs an hour after eating. Does not radiate or impact exercise. Discussed return precautions. ? ?Encounter for annual general medical examination without abnormal findings in adult ?Well-appearing 21 y/o M. Declined STD testing given not sexually active. PHQ score of 1. No medication refills needed at this time. Signed out FL2 form.  Advised follow-up with psychiatry in the near future given recommendation for follow-up every 3 months. ? ?Polycythemia ?History of polycythemia.  Previously worked up by hematology who noted overall no reason for concern.  Consider checking CBC for monitoring purposes in the future. ?  ? ?Annual Examination  ?See AVS for age appropriate recommendations  ?PHQ score 1, reviewed and discussed.  ?Blood pressure reviewed and at goal.    ?Advanced directive not discussed.  ? ?Considered the following items based upon USPSTF  recommendations: ?HIV testing: not indicated ?Hepatitis C: not indicated ?Hepatitis B: not indicated ?Syphilis if at high risk: {not indicated ?GC/CTnot indicated ?Lipid panel (nonfasting or fasting) discussed based upon AHA recommendations and not ordered.  Consider repeat every 4-6 years.  ?Reviewed risk factors for latent tuberculosis and not indicated ?Immunizations: declined HPV ? ?Follow up in 1 year or sooner if indicated.  ? ?26, DO ?[facility]   ?

## 2021-10-29 ENCOUNTER — Encounter: Payer: Self-pay | Admitting: Family Medicine

## 2021-10-29 ENCOUNTER — Ambulatory Visit (INDEPENDENT_AMBULATORY_CARE_PROVIDER_SITE_OTHER): Payer: BC Managed Care – PPO | Admitting: Student

## 2021-10-29 ENCOUNTER — Encounter: Payer: Self-pay | Admitting: Student

## 2021-10-29 VITALS — BP 117/78 | HR 82 | Ht 74.0 in | Wt 238.1 lb

## 2021-10-29 DIAGNOSIS — R079 Chest pain, unspecified: Secondary | ICD-10-CM | POA: Insufficient documentation

## 2021-10-29 DIAGNOSIS — Z0000 Encounter for general adult medical examination without abnormal findings: Secondary | ICD-10-CM | POA: Diagnosis not present

## 2021-10-29 DIAGNOSIS — R4689 Other symptoms and signs involving appearance and behavior: Secondary | ICD-10-CM | POA: Insufficient documentation

## 2021-10-29 DIAGNOSIS — D751 Secondary polycythemia: Secondary | ICD-10-CM | POA: Diagnosis not present

## 2021-10-29 NOTE — Assessment & Plan Note (Addendum)
Well-appearing 21 y/o M. Declined STD testing given not sexually active. PHQ score of 1. No medication refills needed at this time. Signed out FL2 form.  Advised follow-up with psychiatry in the near future given recommendation for follow-up every 3 months. ?

## 2021-10-29 NOTE — Assessment & Plan Note (Signed)
Chronic, brief sharp chest pain lasting ~10s. No pattern of frequency, sometimes occurs an hour after eating. Does not radiate or impact exercise. Discussed return precautions. ?

## 2021-10-29 NOTE — Assessment & Plan Note (Signed)
History of polycythemia.  Previously worked up by hematology who noted overall no reason for concern.  Consider checking CBC for monitoring purposes in the future. ?

## 2021-10-29 NOTE — Patient Instructions (Signed)
It was great to see you today! Thank you for choosing [facility] for your primary care. Ray Carter was seen for annual physical. ? ?Today we addressed: ?Sharp chest pain: Based on your history, this is reassuringly benign. If you have any long lasting pain, exercise intolerance, or noticing further symptoms and increasing frequency of pain, please come back in to be evaluated. ?You received your Tdap vaccination today.  ?Please follow up with psychiatry when able.  ? ?You should return to our clinic as needed ? ?Please arrive 15 minutes before your appointment to ensure smooth check in process.  We appreciate your efforts in making this happen. ? ?Take care and seek immediate care sooner if you develop any concerns.  ? ?Thank you for allowing me to participate in your care, ?Shelby Mattocks, DO ?10/29/2021, 10:06 AM ?PGY-1, [facility] ?  ?

## 2021-10-30 ENCOUNTER — Other Ambulatory Visit: Payer: Self-pay

## 2021-10-30 MED ORDER — FLUTICASONE PROPIONATE 50 MCG/ACT NA SUSP: 2.0000 | Freq: Every day | NASAL | 3 refills | Status: DC

## 2021-11-10 ENCOUNTER — Emergency Department (HOSPITAL_COMMUNITY): Payer: BC Managed Care – PPO

## 2021-11-10 ENCOUNTER — Emergency Department (HOSPITAL_COMMUNITY)
Admission: EM | Admit: 2021-11-10 | Discharge: 2021-11-11 | Disposition: A | Discharge status: Home / Self Care | Payer: BC Managed Care – PPO | Attending: Emergency Medicine | Admitting: Emergency Medicine

## 2021-11-10 ENCOUNTER — Other Ambulatory Visit: Payer: Self-pay

## 2021-11-10 DIAGNOSIS — Z20822 Contact with and (suspected) exposure to covid-19: Secondary | ICD-10-CM | POA: Diagnosis not present

## 2021-11-10 DIAGNOSIS — S60221A Contusion of right hand, initial encounter: Secondary | ICD-10-CM | POA: Diagnosis not present

## 2021-11-10 DIAGNOSIS — W228XXA Striking against or struck by other objects, initial encounter: Secondary | ICD-10-CM | POA: Insufficient documentation

## 2021-11-10 DIAGNOSIS — D582 Other hemoglobinopathies: Secondary | ICD-10-CM | POA: Insufficient documentation

## 2021-11-10 DIAGNOSIS — S6991XA Unspecified injury of right wrist, hand and finger(s), initial encounter: Secondary | ICD-10-CM | POA: Diagnosis present

## 2021-11-10 DIAGNOSIS — F840 Autistic disorder: Secondary | ICD-10-CM | POA: Insufficient documentation

## 2021-11-10 DIAGNOSIS — R4689 Other symptoms and signs involving appearance and behavior: Secondary | ICD-10-CM

## 2021-11-10 LAB — CBC WITH DIFFERENTIAL/PLATELET
Abs Immature Granulocytes: 0.01 10*3/uL (ref 0.00–0.07)
Basophils Absolute: 0 10*3/uL (ref 0.0–0.1)
Basophils Relative: 1 %
Eosinophils Absolute: 0.1 10*3/uL (ref 0.0–0.5)
Eosinophils Relative: 2 %
HCT: 52.9 % — ABNORMAL HIGH (ref 39.0–52.0)
Hemoglobin: 18.5 g/dL — ABNORMAL HIGH (ref 13.0–17.0)
Immature Granulocytes: 0 %
Lymphocytes Relative: 39 %
Lymphs Abs: 2.3 10*3/uL (ref 0.7–4.0)
MCH: 29.9 pg (ref 26.0–34.0)
MCHC: 35 g/dL (ref 30.0–36.0)
MCV: 85.6 fL (ref 80.0–100.0)
Monocytes Absolute: 0.7 10*3/uL (ref 0.1–1.0)
Monocytes Relative: 12 %
Neutro Abs: 2.8 10*3/uL (ref 1.7–7.7)
Neutrophils Relative %: 46 %
Platelets: 220 10*3/uL (ref 150–400)
RBC: 6.18 MIL/uL — ABNORMAL HIGH (ref 4.22–5.81)
RDW: 12.3 % (ref 11.5–15.5)
WBC: 6 10*3/uL (ref 4.0–10.5)
nRBC: 0 % (ref 0.0–0.2)

## 2021-11-10 LAB — COMPREHENSIVE METABOLIC PANEL
ALT: 40 U/L (ref 0–44)
AST: 26 U/L (ref 15–41)
Albumin: 4.3 g/dL (ref 3.5–5.0)
Alkaline Phosphatase: 79 U/L (ref 38–126)
Anion gap: 6 (ref 5–15)
BUN: 12 mg/dL (ref 6–20)
CO2: 25 mmol/L (ref 22–32)
Calcium: 9.1 mg/dL (ref 8.9–10.3)
Chloride: 106 mmol/L (ref 98–111)
Creatinine, Ser: 1.01 mg/dL (ref 0.61–1.24)
GFR, Estimated: >60 mL/min (ref >60)
Glucose, Bld: 102 mg/dL — ABNORMAL HIGH (ref 70–99)
Potassium: 3.8 mmol/L (ref 3.5–5.1)
Sodium: 137 mmol/L (ref 135–145)
Total Bilirubin: 0.9 mg/dL (ref 0.3–1.2)
Total Protein: 7.4 g/dL (ref 6.5–8.1)

## 2021-11-10 LAB — ETHANOL: Alcohol, Ethyl (B): <10 mg/dL (ref <10)

## 2021-11-10 LAB — RESP PANEL BY RT-PCR (FLU A&B, COVID) ARPGX2
Influenza A by PCR: NEGATIVE
Influenza B by PCR: NEGATIVE
SARS Coronavirus 2 by RT PCR: NEGATIVE

## 2021-11-10 MED ORDER — HYDROXYZINE HCL 25 MG PO TABS
25.0000 mg | ORAL_TABLET | Freq: Three times a day (TID) | ORAL | Status: DC
  Administered 2021-11-11: 25 mg via ORAL
  Filled 2021-11-10: qty 1

## 2021-11-10 MED ORDER — BUSPIRONE HCL 10 MG PO TABS
15.0000 mg | ORAL_TABLET | Freq: Three times a day (TID) | ORAL | Status: DC
  Administered 2021-11-10: 15 mg via ORAL
  Filled 2021-11-10: qty 2

## 2021-11-10 MED ORDER — FLUVOXAMINE MALEATE 50 MG PO TABS
100.0000 mg | ORAL_TABLET | Freq: Two times a day (BID) | ORAL | Status: DC
  Administered 2021-11-11: 100 mg via ORAL
  Filled 2021-11-10 (×2): qty 2

## 2021-11-10 MED ORDER — FLUTICASONE PROPIONATE 50 MCG/ACT NA SUSP
2.0000 | Freq: Every day | NASAL | Status: DC
  Filled 2021-11-10: qty 16

## 2021-11-10 MED ORDER — ATOMOXETINE HCL 40 MG PO CAPS
40.0000 mg | ORAL_CAPSULE | Freq: Every morning | ORAL | Status: DC
  Administered 2021-11-11: 40 mg via ORAL
  Filled 2021-11-10 (×2): qty 1

## 2021-11-10 MED ORDER — MELATONIN 3 MG PO TABS
3.0000 mg | ORAL_TABLET | Freq: Every day | ORAL | Status: DC
Start: 2021-11-10 — End: 2021-11-11
  Administered 2021-11-10: 3 mg via ORAL
  Filled 2021-11-10: qty 1

## 2021-11-10 MED ORDER — DOCUSATE SODIUM 100 MG PO CAPS: 100.0000 mg | ORAL_CAPSULE | Freq: Every day | ORAL | Status: DC

## 2021-11-10 MED ORDER — HYDROXYZINE HCL 25 MG PO TABS: 25.0000 mg | ORAL_TABLET | ORAL | Status: DC

## 2021-11-10 MED ORDER — CHLORPROMAZINE HCL 25 MG PO TABS
25.0000 mg | ORAL_TABLET | Freq: Two times a day (BID) | ORAL | Status: DC
  Administered 2021-11-10: 25 mg via ORAL
  Filled 2021-11-10: qty 1
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
## 2021-11-10 NOTE — ED Notes (Signed)
Psych NP at bedside

## 2021-11-10 NOTE — ED Notes (Signed)
This RN attempted to call group home to inform of pt discharge. No answer. Will continue to monitor. ?

## 2021-11-10 NOTE — ED Notes (Signed)
This RN in communication with Psych NP Julieanne Cotton- who informs this RN that she had been in contact with group home ;who plans to pick pt up in the morning. Will continue to monitor.  ?

## 2021-11-10 NOTE — ED Notes (Signed)
"  ED HAPPY MEAL" Provided  ?
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
## 2021-11-11 DIAGNOSIS — S60221A Contusion of right hand, initial encounter: Secondary | ICD-10-CM | POA: Diagnosis not present

## 2022-01-10 ENCOUNTER — Other Ambulatory Visit: Payer: Self-pay

## 2022-01-10 MED ORDER — CETIRIZINE HCL 10 MG PO TABS: 10.0000 mg | ORAL_TABLET | Freq: Every day | ORAL | 11 refills | Status: DC

## 2022-01-10 MED ORDER — DOCUSATE SODIUM 100 MG PO CAPS: 100.0000 mg | ORAL_CAPSULE | Freq: Every day | ORAL | 11 refills | Status: DC

## 2022-01-28 ENCOUNTER — Other Ambulatory Visit: Payer: Self-pay | Admitting: *Deleted

## 2022-01-28 MED ORDER — MELATONIN 3 MG PO TABS: 3.0000 mg | ORAL_TABLET | Freq: Every day | ORAL | 11 refills | Status: DC

## 2022-01-28 MED ORDER — VITAMIN D3 25 MCG PO TABS
1000.0000 [IU] | ORAL_TABLET | Freq: Every day | ORAL | 11 refills | Status: DC
Start: 2022-01-28 — End: 2022-10-16

## 2022-03-07 ENCOUNTER — Other Ambulatory Visit: Payer: Self-pay | Admitting: *Deleted

## 2022-03-07 MED ORDER — FLUTICASONE PROPIONATE 50 MCG/ACT NA SUSP
2.0000 | Freq: Every day | NASAL | 3 refills | Status: DC
Start: 2022-03-07 — End: 2022-09-25

## 2022-04-30 ENCOUNTER — Other Ambulatory Visit: Payer: Self-pay

## 2022-04-30 ENCOUNTER — Emergency Department (HOSPITAL_COMMUNITY)
Admission: EM | Admit: 2022-04-30 | Discharge: 2022-05-01 | Disposition: A | Discharge status: Home / Self Care | Payer: BC Managed Care – PPO | Attending: Emergency Medicine | Admitting: Emergency Medicine

## 2022-04-30 ENCOUNTER — Encounter (HOSPITAL_COMMUNITY): Payer: Self-pay

## 2022-04-30 DIAGNOSIS — R454 Irritability and anger: Secondary | ICD-10-CM | POA: Diagnosis present

## 2022-04-30 DIAGNOSIS — F840 Autistic disorder: Secondary | ICD-10-CM | POA: Diagnosis not present

## 2022-04-30 DIAGNOSIS — Z1152 Encounter for screening for COVID-19: Secondary | ICD-10-CM | POA: Diagnosis not present

## 2022-04-30 DIAGNOSIS — R45850 Homicidal ideations: Secondary | ICD-10-CM | POA: Insufficient documentation

## 2022-04-30 LAB — CBC
HCT: 52.8 % — ABNORMAL HIGH (ref 39.0–52.0)
Hemoglobin: 17.7 g/dL — ABNORMAL HIGH (ref 13.0–17.0)
MCH: 28.5 pg (ref 26.0–34.0)
MCHC: 33.5 g/dL (ref 30.0–36.0)
MCV: 85.2 fL (ref 80.0–100.0)
Platelets: 247 10*3/uL (ref 150–400)
RBC: 6.2 MIL/uL — ABNORMAL HIGH (ref 4.22–5.81)
RDW: 13.1 % (ref 11.5–15.5)
WBC: 7.2 10*3/uL (ref 4.0–10.5)
nRBC: 0 % (ref 0.0–0.2)

## 2022-04-30 LAB — COMPREHENSIVE METABOLIC PANEL
ALT: 70 U/L — ABNORMAL HIGH (ref 0–44)
AST: 32 U/L (ref 15–41)
Albumin: 4.3 g/dL (ref 3.5–5.0)
Alkaline Phosphatase: 76 U/L (ref 38–126)
Anion gap: 7 (ref 5–15)
BUN: 12 mg/dL (ref 6–20)
CO2: 27 mmol/L (ref 22–32)
Calcium: 9.1 mg/dL (ref 8.9–10.3)
Chloride: 105 mmol/L (ref 98–111)
Creatinine, Ser: 1.17 mg/dL (ref 0.61–1.24)
GFR, Estimated: >60 mL/min (ref >60)
Glucose, Bld: 142 mg/dL — ABNORMAL HIGH (ref 70–99)
Potassium: 3.8 mmol/L (ref 3.5–5.1)
Sodium: 139 mmol/L (ref 135–145)
Total Bilirubin: 0.4 mg/dL (ref 0.3–1.2)
Total Protein: 7.3 g/dL (ref 6.5–8.1)

## 2022-04-30 LAB — ACETAMINOPHEN LEVEL: Acetaminophen (Tylenol), Serum: <10 ug/mL — ABNORMAL LOW (ref 10–30)

## 2022-04-30 LAB — RESP PANEL BY RT-PCR (FLU A&B, COVID) ARPGX2
Influenza A by PCR: NEGATIVE
Influenza B by PCR: NEGATIVE
SARS Coronavirus 2 by RT PCR: NEGATIVE

## 2022-04-30 LAB — ETHANOL: Alcohol, Ethyl (B): <10 mg/dL (ref <10)

## 2022-04-30 LAB — SALICYLATE LEVEL: Salicylate Lvl: <7 mg/dL — ABNORMAL LOW (ref 7.0–30.0)
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
## 2022-04-30 NOTE — ED Notes (Signed)
[employer] officers dropped patient off voluntarily. [employer] refuses to help patient back home.

## 2022-04-30 NOTE — ED Notes (Signed)
Called pt's Group Home staff Natasha Holding to inform them of pt discharge. They advised that they cannot come pick pt up and requested to have [employer] drop him off.

## 2022-04-30 NOTE — ED Notes (Addendum)
Called Pt's Group home staff, she states that they cannot come pick pt up tonight. They advised that they will arrive in the AM around 930-10 to pick him up. Notified charge RN

## 2022-04-30 NOTE — Discharge Instructions (Addendum)
Aleks was medically cleared in the ER tonight.
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
## 2022-04-30 NOTE — ED Triage Notes (Signed)
Pt BIB [employer] voluntary. Pt reports that he is from a group home and they put their hands on him. Pt reports having homicidal thoughts and states that he is a white supremacist. Pt has an [place] flag with him.

## 2022-05-01 NOTE — ED Notes (Signed)
Per tech in triage, pt refused to dress out. Re-attempted to dress pt out. Pt refused. No reason specified. Scrubs at bedside. Naval architect aware.

## 2022-05-01 NOTE — ED Notes (Signed)
Called Natosha from the group home and she said the staff is on the way to get Shanon Brow

## 2022-05-01 NOTE — ED Notes (Signed)
Pt asleep. Will hold off VS

## 2022-05-16 ENCOUNTER — Emergency Department (HOSPITAL_COMMUNITY)
Admission: EM | Admit: 2022-05-16 | Discharge: 2022-05-16 | Disposition: A | Discharge status: Home / Self Care | Payer: BC Managed Care – PPO | Attending: Emergency Medicine | Admitting: Emergency Medicine

## 2022-05-16 ENCOUNTER — Encounter (HOSPITAL_COMMUNITY): Payer: Self-pay

## 2022-05-16 ENCOUNTER — Other Ambulatory Visit: Payer: Self-pay

## 2022-05-16 DIAGNOSIS — R454 Irritability and anger: Secondary | ICD-10-CM | POA: Diagnosis present

## 2022-05-16 NOTE — ED Provider Notes (Signed)
[facility]-EMERGENCY DEPT Provider Note   CSN: [id number] Arrival date & time: 05/16/22  1839     History  Chief Complaint  Patient presents with   Mental Health Problem    Ray Carter is a 21 y.o. male presenting with concern for anger.  He tells me that he called 911 because he thought he would get in trouble for breaking his property.  Has a history of uncontrolled anger.  Previously saw a therapist but he was released from therapy after "saying something inappropriate."  Denies SI/HI.  No AVH.  No substance use.   Mental Health Problem      Home Medications Prior to Admission medications   Medication Sig Start Date End Date Taking? Authorizing Provider  atomoxetine (STRATTERA) 40 MG capsule Take 40 mg by mouth in the morning.    [provider]  busPIRone (BUSPAR) 15 MG tablet Take 15 mg by mouth 3 (three) times daily. 07/31/21   [provider]  cetirizine (ZYRTEC) 10 MG tablet Take 1 tablet (10 mg total) by mouth daily. Please make appointment for further refills. 01/10/22   Shelby Mattocks, DO  chlorproMAZINE (THORAZINE) 25 MG tablet Take 25 mg by mouth in the morning and at bedtime.    [provider]  chlorproMAZINE (THORAZINE) 50 MG tablet Take 50 mg by mouth daily.    [provider]  docusate sodium (STOOL SOFTENER) 100 MG capsule Take 1 capsule (100 mg total) by mouth daily. 01/10/22   Shelby Mattocks, DO  doxycycline (VIBRAMYCIN) 100 MG capsule Take 1 capsule (100 mg total) by mouth 2 (two) times daily. Patient not taking: Reported on 08/04/2021 03/06/21   Tomi Bamberger, PA-C  fluticasone [facility]) 50 MCG/ACT nasal spray Place 2 sprays into both nostrils daily. 03/07/22   Erick Alley, DO  fluvoxaMINE (LUVOX) 100 MG tablet Take 100 mg by mouth in the morning and at bedtime.    [provider]  hydrOXYzine (ATARAX) 25 MG tablet Take 25 mg by mouth 3 (three) times daily. 08/02/21   [provider]   melatonin 3 MG TABS tablet Take 1 tablet (3 mg total) by mouth at bedtime. 01/28/22   Shelby Mattocks, DO  vitamin D3 (CHOLECALCIFEROL) 25 MCG tablet Take 1 tablet (1,000 Units total) by mouth daily. 01/28/22   Shelby Mattocks, DO      Allergies    Patient has no known allergies.    Review of Systems   Review of Systems  Physical Exam Updated Vital Signs BP 124/79 (BP Location: Left Arm)   Pulse (!) 101   Temp 98.7 F (37.1 C) (Oral)   Resp 16   SpO2 99%  Physical Exam Vitals and nursing note reviewed.  Constitutional:      Appearance: Normal appearance.  HENT:     Head: Normocephalic and atraumatic.  Eyes:     General: No scleral icterus.    Conjunctiva/sclera: Conjunctivae normal.  Pulmonary:     Effort: Pulmonary effort is normal. No respiratory distress.  Skin:    Findings: No rash.  Neurological:     Mental Status: He is alert.  Psychiatric:        Mood and Affect: Mood normal.     ED Results / Procedures / Treatments   Labs (all labs ordered are listed, but only abnormal results are displayed) Labs Reviewed - No data to display  EKG None  Radiology No results found.  Procedures Procedures   Medications Ordered in ED Medications -  No data to display  ED Course/ Medical Decision Making/ A&P                           Medical Decision Making  21 year old male presenting today due to the concerns of his anger.  Called 911 because he thought he would get in trouble for breaking property.  No homicidal ideations or suicidal ideation.  Consults: Called behavioral health urgent care and spoke with staff that patient will likely walk in.  DM/disposition: 21 year old male who does not appear to be having an acute psychiatric ailment.  Does not require further work-up from the emergency department but he should get in touch with behavioral health providers.  Given referral to behavioral health urgent care and instructed to walk into the clinic.  About this with  my attending physician, Dr. Lynelle Doctor who agrees with the treatment plan   Final Clinical Impression(s) / ED Diagnoses Final diagnoses:  Difficulty controlling anger    Rx / DC Orders ED Discharge Orders     None      Results and diagnoses were explained to the patient. Return precautions discussed in full. Patient had no additional questions and expressed complete understanding.   This chart was dictated using voice recognition software.  Despite best efforts to proofread,  errors can occur which can change the documentation meaning.    Woodroe Chen 05/16/22 2008    Linwood Dibbles, MD 05/19/22 [phone]

## 2022-05-16 NOTE — Discharge Instructions (Addendum)
Please present to the behavioral health urgent care for further evaluation for mental health resources.  Their address is below.  [address] [place], [place] [address]

## 2022-05-16 NOTE — ED Triage Notes (Signed)
Patient said the police brought him here. He said every time he gets mad the ED is his safe place and he told them to bring him here. He wants to punch a wall and do property damage. No thoughts of hurting himself.

## 2022-05-17 ENCOUNTER — Ambulatory Visit (HOSPITAL_COMMUNITY)
Admission: EM | Admit: 2022-05-17 | Discharge: 2022-05-17 | Disposition: A | Discharge status: Home / Self Care | Payer: BC Managed Care – PPO

## 2022-05-17 DIAGNOSIS — F4325 Adjustment disorder with mixed disturbance of emotions and conduct: Secondary | ICD-10-CM

## 2022-05-17 DIAGNOSIS — G3184 Mild cognitive impairment, so stated: Secondary | ICD-10-CM | POA: Diagnosis not present

## 2022-05-17 NOTE — ED Provider Notes (Signed)
Behavioral Health Urgent Care Medical Screening Exam  Patient Name: Ray Carter MRN: [id number] Date of Evaluation: 05/17/22 Chief Complaint:   Diagnosis:  Final diagnoses:  Mild cognitive impairment  Adjustment disorder with mixed disturbance of emotions and conduct    History of Present illness: Ray Carter is a 21 y.o. male.  Patient presents voluntarily to [facility] health, transported by Patent examiner.  Patient states "I called the police on myself because I got mad at something."  Patient reports he asked group home staff to visit the home of a friend.  Group home staff did not permit patient to visit the home of the friend and patient became angry.  Patient is able to discuss coping skills that he can use when angry including "throwing or bouncing a soft ball or listening to music on my phone."  Ray Carter states "I just need a new group home.  I want to go somewhere different.  I might not be able to go back, but I would like to go to back to a group home in [place] called [name].  I keep telling them I want  a group home every since February."  Reviewed with placed patient, at length, process required to update group home, he verbalizes understanding.  Patient states "if I do not have a good Friday that I do not have a good weekend."  He was seen in emergency department on yesterday related to making an inappropriate statement towards staff member at group home demanding that staff member "kiss me or I will leave this car."  Patient is insightful today understands statement was inappropriate, he would not make a similar statement moving forward.  Ray Carter diagnoses include adjustment disorder, attention deficit hyperactivity disorder, disruptive mood dysregulation disorder and aggressive behavior of adult.  Additional diagnoses include autism spectrum disorder and mild cognitive impairment.  He is compliant with medications.  He is unable to recall medications  specifically states "I know I take them 3 times a day but the names are hard to pronounce."  Medications administered by group home staff. Medications prescribed by outpatient psychiatry at [facility].  He denies history of inpatient psychiatric admission.  No family mental health history reported.  [facility].  He denies access to weapons.  He enjoys attending [facility] day program on Tuesday through Friday.  He denies alcohol and substance use.  He endorses average sleep and appetite.  Patient offered support and encouragement.  He remains his own guardian.  Patient gives verbal consent to speak with group home staff member [facility] Phone number [phone].  Spoke with group home staff member who denies safety concerns.  She verbalizes understanding standing and agreement of treatment plan to include law enforcement returning patient to group home this afternoon.  Ray Carter shares that patient became frustrated on yesterday after he wanted staff member to give him $40 and they refused.   Patient and family are educated and verbalize understanding of mental health resources and other crisis services in the community. They are instructed to call 911 and present to the nearest emergency room should patient experience any suicidal/homicidal ideation, auditory/visual/hallucinations, or detrimental worsening of mental health condition.     Flowsheet Row ED from 05/17/2022 in [facility] ED from 05/16/2022 in [facility] ED from 04/30/2022 in [facility]ERGENC[facility] DEPT  C-SSRS RISK CATEGORY No Risk No Risk No Risk       Psychiatric  Specialty Exam  Presentation  General Appearance:Appropriate for Environment; Casual  Eye Contact:Good  Speech:Clear and Coherent; Normal Rate  Speech Volume:Normal  Handedness:Right   Mood and Affect   Mood: Euthymic  Affect: Appropriate; Congruent   Thought Process  Thought Processes: Coherent; Goal Directed; Linear  Descriptions of Associations:Intact  Orientation:Full (Time, Place and Person)  Thought Content:Logical; WDL  Diagnosis of Schizophrenia or Schizoaffective disorder in past: No data recorded  Hallucinations:None  Ideas of Reference:None  Suicidal Thoughts:No  Homicidal Thoughts:No   Sensorium  Memory: Immediate Good; Recent Good  Judgment: Intact  Insight: Present   Executive Functions  Concentration: Fair  Attention Span: Fair  Recall: [employer] of Knowledge: Fair  Language: Fair   Psychomotor Activity  Psychomotor Activity: Normal   Assets  Assets: Manufacturing systems engineer; Desire for Improvement; Physical Health; Resilience; Social Support   Sleep  Sleep: Good  Number of hours:  11   No data recorded  Physical Exam: Physical Exam Vitals and nursing note reviewed.  Constitutional:      Appearance: Normal appearance. He is well-developed.  HENT:     Head: Normocephalic and atraumatic.     Nose: Nose normal.  Cardiovascular:     Rate and Rhythm: Normal rate.  Pulmonary:     Effort: Pulmonary effort is normal.  Musculoskeletal:        General: Normal range of motion.     Cervical back: Normal range of motion.  Skin:    General: Skin is warm and dry.  Neurological:     Mental Status: He is alert and oriented to person, place, and time.  Psychiatric:        Attention and Perception: Attention and perception normal.        Mood and Affect: Mood and affect normal.        Speech: Speech normal.        Behavior: Behavior normal. Behavior is cooperative.        Thought Content: Thought content normal.    Review of Systems  Constitutional: Negative.   HENT: Negative.    Eyes: Negative.   Respiratory: Negative.    Cardiovascular: Negative.   Gastrointestinal: Negative.   Genitourinary: Negative.    Musculoskeletal: Negative.   Skin: Negative.   Neurological: Negative.   Psychiatric/Behavioral: Negative.     Blood pressure 129/82, pulse 96, temperature 98.7 F (37.1 C), temperature source Oral, SpO2 98 %. There is no height or weight on file to calculate BMI.  Musculoskeletal: Strength & Muscle Tone: within normal limits Gait & Station: normal Patient leans: N/A   [facility] MSE Discharge Disposition for Follow up and Recommendations: Based on my evaluation the patient does not appear to have an emergency medical condition and can be discharged with resources and follow up care in outpatient services for Medication Management and Individual Therapy Follow-up with outpatient psychiatry, resources provided. Continue current medications.   Lenard Lance, FNP 05/17/2022, 12:52 PM

## 2022-05-17 NOTE — Discharge Instructions (Signed)

## 2022-05-17 NOTE — ED Triage Notes (Signed)
Pt presents tgo [facility] voluntarily escorted by [employer] due to making a comment to his group home worker that he wanted to hurt himself. Pt states he did not mean this statement he only said this because the group home worker that he has a crush on rejected him. Pt states he was in the vehicle with the group home worker and told her that he refuses to exit the vehicle, only under one condition and the condition was giving him a kiss. Pt denies SI/HI and AVH.

## 2022-07-16 ENCOUNTER — Other Ambulatory Visit: Payer: Self-pay

## 2022-07-16 ENCOUNTER — Emergency Department (HOSPITAL_COMMUNITY): Payer: Medicaid Other

## 2022-07-16 ENCOUNTER — Emergency Department (HOSPITAL_COMMUNITY)
Admission: EM | Admit: 2022-07-16 | Discharge: 2022-07-17 | Disposition: A | Discharge status: Home / Self Care | Payer: Medicaid Other | Attending: Emergency Medicine | Admitting: Emergency Medicine

## 2022-07-16 DIAGNOSIS — R079 Chest pain, unspecified: Secondary | ICD-10-CM | POA: Diagnosis present

## 2022-07-16 DIAGNOSIS — F840 Autistic disorder: Secondary | ICD-10-CM | POA: Insufficient documentation

## 2022-07-16 DIAGNOSIS — R0789 Other chest pain: Secondary | ICD-10-CM

## 2022-07-16 LAB — BASIC METABOLIC PANEL
Anion gap: 10 (ref 5–15)
BUN: 20 mg/dL (ref 6–20)
CO2: 25 mmol/L (ref 22–32)
Calcium: 9.3 mg/dL (ref 8.9–10.3)
Chloride: 104 mmol/L (ref 98–111)
Creatinine, Ser: 1.19 mg/dL (ref 0.61–1.24)
GFR, Estimated: >60 mL/min (ref >60)
Glucose, Bld: 88 mg/dL (ref 70–99)
Potassium: 3.9 mmol/L (ref 3.5–5.1)
Sodium: 139 mmol/L (ref 135–145)

## 2022-07-16 LAB — CBC
HCT: 53.6 % — ABNORMAL HIGH (ref 39.0–52.0)
Hemoglobin: 17.6 g/dL — ABNORMAL HIGH (ref 13.0–17.0)
MCH: 27.7 pg (ref 26.0–34.0)
MCHC: 32.8 g/dL (ref 30.0–36.0)
MCV: 84.4 fL (ref 80.0–100.0)
Platelets: 241 10*3/uL (ref 150–400)
RBC: 6.35 MIL/uL — ABNORMAL HIGH (ref 4.22–5.81)
RDW: 13.2 % (ref 11.5–15.5)
WBC: 6.6 10*3/uL (ref 4.0–10.5)
nRBC: 0 % (ref 0.0–0.2)

## 2022-07-16 LAB — TROPONIN I (HIGH SENSITIVITY): Troponin I (High Sensitivity): <2 ng/L (ref <18)

## 2022-07-16 NOTE — ED Provider Triage Note (Signed)
Emergency Medicine Provider Triage Evaluation Note  Ray Carter , a 22 y.o. male  was evaluated in triage.  Pt complains of anxiety and chest pain.  Patient reports that he has had worsening anxiety over the last several weeks.  Patient reports that he currently takes some of his medications for his mood but is unsure what all these medications are.  Patient denies any thoughts of suicide or self-harm.  Patient did report that he has had some thoughts of harming others and homicidal ideation recently.  Denies any auditory visual hallucinations..  Review of Systems  Positive: As above Negative: As above  Physical Exam  BP 136/85 (BP Location: Right Arm)   Pulse (!) 106   Temp 98.3 F (36.8 C) (Oral)   Resp 18   Ht 6\' 3"  (1.905 m)   Wt 108.9 kg   SpO2 95%   BMI 30.00 kg/m  Gen:   Awake, no distress   Resp:  Normal effort  MSK:   Moves extremities without difficulty  Other:    Medical Decision Making  Medically screening exam initiated at 3:53 PM.  Appropriate orders placed.  Ray Carter was informed that the remainder of the evaluation will be completed by another provider, this initial triage assessment does not replace that evaluation, and the importance of remaining in the ED until their evaluation is complete.     Luvenia Heller, PA-C 07/16/22 1554

## 2022-07-16 NOTE — ED Triage Notes (Signed)
Pt reports feeling stressed x 2 weeks also reports left sided chest pain that comes and goes x 3 years.

## 2022-07-17 NOTE — ED Provider Notes (Signed)
[facility] DEPT Provider Note  CSN: [id number] Arrival date & time: 07/16/22 1354  Chief Complaint(s) Chest Pain  HPI Ray Carter is a 22 y.o. male with a past medical history listed below including anxiety, autism currently living in a group home here for intermittent chest pain usually brought on by emotional stress.  Pain is sharp left-sided.  Fluctuating in nature.  Ongoing for numerous years.  Nonexertional and nonradiating.  No other alleviating or aggravating factors.  He denies any associated shortness of breath.  No recent fevers or infections.  No cough or congestion.  No current chest pain stating that his pain resolved as soon as he arrived more than 12 hours ago.  Patient denies any SI, HI, AVH.  The history is provided by the patient.    Past Medical History Past Medical History:  Diagnosis Date   Adjustment disorder with mixed disturbance of emotions and conduct    Anxiety    Attention deficit hyperactivity disorder (ADHD), combined type    Autism    Disruptive mood dysregulation disorder ([place])    History of Aggressive behavior    Patient Active Problem List   Diagnosis Date Noted   Encounter for annual general medical examination without abnormal findings in adult 10/29/2021   Chest pain of uncertain etiology 20/25/4270   History of Aggressive behavior    Attention deficit hyperactivity disorder (ADHD), combined type    Adjustment disorder with mixed disturbance of emotions and conduct    Overweight 07/15/2020   Polycythemia 07/15/2020   Mild cognitive impairment 07/13/2020   Psychiatric disorder 07/13/2020   Hypersomnolence 12/13/2019   Home Medication(s) Prior to Admission medications   Medication Sig Start Date End Date Taking? Authorizing Provider  atomoxetine (STRATTERA) 40 MG capsule Take 40 mg by mouth in the morning.    [provider]  busPIRone (BUSPAR) 15 MG tablet Take 15 mg by mouth 3 (three) times daily.  07/31/21   [provider]  cetirizine (ZYRTEC) 10 MG tablet Take 1 tablet (10 mg total) by mouth daily. Please make appointment for further refills. 01/10/22   Wells Guiles, DO  chlorproMAZINE (THORAZINE) 25 MG tablet Take 25 mg by mouth in the morning and at bedtime.    [provider]  chlorproMAZINE (THORAZINE) 50 MG tablet Take 50 mg by mouth daily.    [provider]  docusate sodium (STOOL SOFTENER) 100 MG capsule Take 1 capsule (100 mg total) by mouth daily. 01/10/22   Wells Guiles, DO  doxycycline (VIBRAMYCIN) 100 MG capsule Take 1 capsule (100 mg total) by mouth 2 (two) times daily. Patient not taking: Reported on 08/04/2021 03/06/21   Francene Finders, PA-C  fluticasone [facility]) 50 MCG/ACT nasal spray Place 2 sprays into both nostrils daily. 03/07/22   Precious Gilding, DO  fluvoxaMINE (LUVOX) 100 MG tablet Take 100 mg by mouth in the morning and at bedtime.    [provider]  hydrOXYzine (ATARAX) 25 MG tablet Take 25 mg by mouth 3 (three) times daily. 08/02/21   [provider]  melatonin 3 MG TABS tablet Take 1 tablet (3 mg total) by mouth at bedtime. 01/28/22   Wells Guiles, DO  vitamin D3 (CHOLECALCIFEROL) 25 MCG tablet Take 1 tablet (1,000 Units total) by mouth daily. 01/28/22   Wells Guiles, DO  Allergies Patient has no known allergies.  Review of Systems Review of Systems As noted in HPI  Physical Exam Vital Signs  I have reviewed the triage vital signs BP 130/88   Pulse 97   Temp 97.7 F (36.5 C) (Oral)   Resp 17   Ht 6\' 3"  (1.905 m)   Wt 108.9 kg   SpO2 100%   BMI 30.00 kg/m   Physical Exam Vitals reviewed.  Constitutional:      General: He is not in acute distress.    Appearance: He is well-developed. He is not diaphoretic.  HENT:     Head: Normocephalic and atraumatic.     Nose: Nose normal.   Eyes:     General: No scleral icterus.       Right eye: No discharge.        Left eye: No discharge.     Conjunctiva/sclera: Conjunctivae normal.     Pupils: Pupils are equal, round, and reactive to light.  Cardiovascular:     Rate and Rhythm: Normal rate and regular rhythm.     Heart sounds: No murmur heard.    No friction rub. No gallop.  Pulmonary:     Effort: Pulmonary effort is normal. No respiratory distress.     Breath sounds: Normal breath sounds. No stridor. No rales.  Abdominal:     General: There is no distension.     Palpations: Abdomen is soft.     Tenderness: There is no abdominal tenderness.  Musculoskeletal:        General: No tenderness.     Cervical back: Normal range of motion and neck supple.  Skin:    General: Skin is warm and dry.     Findings: No erythema or rash.  Neurological:     Mental Status: He is alert and oriented to person, place, and time.     ED Results and Treatments Labs (all labs ordered are listed, but only abnormal results are displayed) Labs Reviewed  CBC - Abnormal; Notable for the following components:      Result Value   RBC 6.35 (*)    Hemoglobin 17.6 (*)    HCT 53.6 (*)    All other components within normal limits  BASIC METABOLIC PANEL  TROPONIN I (HIGH SENSITIVITY)  TROPONIN I (HIGH SENSITIVITY)                                                                                                                         EKG  EKG Interpretation  Date/Time:  Wednesday July 16 2022 16:03:55 EST Ventricular Rate:  104 PR Interval:  174 QRS Duration: 90 QT Interval:  336 QTC Calculation: 442 R Axis:   111 Text Interpretation: Sinus tachycardia Right axis deviation No significant change was found Confirmed by Addison Lank [phone]) on 07/17/2022 3:58:39 AM       Radiology DG Chest Port 1 View  Result Date: 07/16/2022 CLINICAL DATA:  Chest pain EXAM: PORTABLE CHEST 1 VIEW  COMPARISON:  None Available. FINDINGS: The  cardiomediastinal silhouette is within normal limits. There is no focal airspace consolidation. There is no pleural effusion or evidence of pneumothorax. There is no acute osseous abnormality. IMPRESSION: No evidence of acute cardiopulmonary disease. Electronically Signed   By: Caprice Renshaw M.D.   On: 07/16/2022 16:01    Medications Ordered in ED Medications - No data to display                                                                                                                                   Procedures Procedures  (including critical care time)  Medical Decision Making / ED Course   Medical Decision Making Amount and/or Complexity of Data Reviewed Labs: ordered. Decision-making details documented in ED Course. Radiology: ordered and independent interpretation performed. Decision-making details documented in ED Course. ECG/medicine tests: ordered and independent interpretation performed. Decision-making details documented in ED Course.    Highly atypical chest pain.  Not consistent with ACS.  EKG without acute ischemic changes or evidence of pericarditis.  Troponin was negative.  Do not feel that additional cardiac enzymes are necessary at this time.  Presentation is not concerning for pulmonary emboli and patient has low pretest probability.  Not classic for direct dissection or esophageal perforation. Chest x-ray without evidence of pneumonia, pneumothorax, pulmonary edema or pleural effusions. CBC without leukocytosis or anemia. BMP without significant electrolyte derangements or renal sufficiency.      Final Clinical Impression(s) / ED Diagnoses Final diagnoses:  Intermittent left-sided chest pain   The patient appears reasonably screened and/or stabilized for discharge and I doubt any other medical condition or other [facility] requiring further screening, evaluation, or treatment in the ED at this time. I have discussed the findings, Dx and Tx plan with the patient/family  who expressed understanding and agree(s) with the plan. Discharge instructions discussed at length. The patient/family was given strict return precautions who verbalized understanding of the instructions. No further questions at time of discharge.  Disposition: Discharge  Condition: Good  ED Discharge Orders     None       Follow Up: Primary care provider  Call  to schedule an appointment for close follow up           This chart was dictated using voice recognition software.  Despite best efforts to proofread,  errors can occur which can change the documentation meaning.    Nira Conn, MD 07/17/22 [phone]

## 2022-07-17 NOTE — ED Notes (Signed)
Group home called, they will be on the way to pick pt up in 1 hr.

## 2022-09-25 ENCOUNTER — Other Ambulatory Visit: Payer: Self-pay

## 2022-09-29 MED ORDER — FLUTICASONE PROPIONATE 50 MCG/ACT NA SUSP: 2.0000 | Freq: Every day | NASAL | 3 refills | Status: DC

## 2022-10-16 ENCOUNTER — Other Ambulatory Visit: Payer: Self-pay

## 2022-10-17 MED ORDER — VITAMIN D3 25 MCG PO TABS: 1000.0000 [IU] | ORAL_TABLET | Freq: Every day | ORAL | 11 refills | Status: DC

## 2022-10-17 MED ORDER — DOCUSATE SODIUM 100 MG PO CAPS: 100.0000 mg | ORAL_CAPSULE | Freq: Every day | ORAL | 11 refills | Status: DC

## 2022-11-09 ENCOUNTER — Other Ambulatory Visit: Payer: Self-pay

## 2022-11-09 ENCOUNTER — Emergency Department (HOSPITAL_COMMUNITY)
Admission: EM | Admit: 2022-11-09 | Discharge: 2022-11-10 | Disposition: A | Discharge status: Other Acute Inpatient Hospital | Payer: Medicaid Other | Attending: Emergency Medicine | Admitting: Emergency Medicine

## 2022-11-09 DIAGNOSIS — F840 Autistic disorder: Secondary | ICD-10-CM | POA: Insufficient documentation

## 2022-11-09 DIAGNOSIS — R45851 Suicidal ideations: Secondary | ICD-10-CM | POA: Diagnosis not present

## 2022-11-09 DIAGNOSIS — R7401 Elevation of levels of liver transaminase levels: Secondary | ICD-10-CM | POA: Diagnosis not present

## 2022-11-09 LAB — CBC
HCT: 55.3 % — ABNORMAL HIGH (ref 39.0–52.0)
Hemoglobin: 18.4 g/dL — ABNORMAL HIGH (ref 13.0–17.0)
MCH: 27.3 pg (ref 26.0–34.0)
MCHC: 33.3 g/dL (ref 30.0–36.0)
MCV: 81.9 fL (ref 80.0–100.0)
Platelets: 311 10*3/uL (ref 150–400)
RBC: 6.75 MIL/uL — ABNORMAL HIGH (ref 4.22–5.81)
RDW: 13.1 % (ref 11.5–15.5)
WBC: 7.4 10*3/uL (ref 4.0–10.5)
nRBC: 0 % (ref 0.0–0.2)

## 2022-11-09 LAB — RAPID URINE DRUG SCREEN, HOSP PERFORMED
Amphetamines: NOT DETECTED
Barbiturates: NOT DETECTED
Benzodiazepines: NOT DETECTED
Cocaine: NOT DETECTED
Opiates: NOT DETECTED
Tetrahydrocannabinol: NOT DETECTED

## 2022-11-09 LAB — COMPREHENSIVE METABOLIC PANEL
ALT: 69 U/L — ABNORMAL HIGH (ref 0–44)
AST: 44 U/L — ABNORMAL HIGH (ref 15–41)
Albumin: 4.9 g/dL (ref 3.5–5.0)
Alkaline Phosphatase: 81 U/L (ref 38–126)
Anion gap: 11 (ref 5–15)
BUN: 11 mg/dL (ref 6–20)
CO2: 23 mmol/L (ref 22–32)
Calcium: 9.2 mg/dL (ref 8.9–10.3)
Chloride: 104 mmol/L (ref 98–111)
Creatinine, Ser: 1.11 mg/dL (ref 0.61–1.24)
GFR, Estimated: >60 mL/min (ref >60)
Glucose, Bld: 125 mg/dL — ABNORMAL HIGH (ref 70–99)
Potassium: 4.1 mmol/L (ref 3.5–5.1)
Sodium: 138 mmol/L (ref 135–145)
Total Bilirubin: 1.2 mg/dL (ref 0.3–1.2)
Total Protein: 8.4 g/dL — ABNORMAL HIGH (ref 6.5–8.1)

## 2022-11-09 LAB — ETHANOL: Alcohol, Ethyl (B): <10 mg/dL (ref <10)

## 2022-11-09 LAB — SALICYLATE LEVEL: Salicylate Lvl: <7 mg/dL — ABNORMAL LOW (ref 7.0–30.0)

## 2022-11-09 LAB — CBG MONITORING, ED: Glucose-Capillary: 138 mg/dL — ABNORMAL HIGH (ref 70–99)

## 2022-11-09 LAB — ACETAMINOPHEN LEVEL: Acetaminophen (Tylenol), Serum: <10 ug/mL — ABNORMAL LOW (ref 10–30)

## 2022-11-09 NOTE — ED Triage Notes (Signed)
Pt BIB [employer] from group home. Pt had called [employer] and asked to be picked up. Had endorsed SI to officers on arrival.  Pt calm on arrival, and says they no longer endorse SI.  Pt voluntary.

## 2022-11-09 NOTE — ED Provider Notes (Signed)
[place] EMERGENCY DEPARTMENT AT [facility] Provider Note   CSN: [id number] Arrival date & time: 11/09/22  1747     History  Chief Complaint  Patient presents with   Suicidal    Ray Carter is a 22 y.o. male, history of ADHD, aggressive disorder, who presents to the ED secondary to SI.  He states that earlier today, he was reprimanded by people in his group home, for doing something, and he did not like it, so he barricaded himself into his room, and started throwing things.  He denies any trauma to himself however.  Notes that he was suicidal earlier, but is not currently suicidal.  He denies any any SI, HI, AVH at this time.  At the time he was suicidal he did not have a plan, and he does not currently have a plan.     Home Medications Prior to Admission medications   Medication Sig Start Date End Date Taking? Authorizing Provider  atomoxetine (STRATTERA) 40 MG capsule Take 40 mg by mouth in the morning.    [provider]  busPIRone (BUSPAR) 15 MG tablet Take 15 mg by mouth 3 (three) times daily. 07/31/21   [provider]  cetirizine (ZYRTEC) 10 MG tablet Take 1 tablet (10 mg total) by mouth daily. Please make appointment for further refills. 01/10/22   Shelby Mattocks, DO  chlorproMAZINE (THORAZINE) 25 MG tablet Take 25 mg by mouth in the morning and at bedtime.    [provider]  chlorproMAZINE (THORAZINE) 50 MG tablet Take 50 mg by mouth daily.    [provider]  docusate sodium (STOOL SOFTENER) 100 MG capsule Take 1 capsule (100 mg total) by mouth daily. 10/17/22   Erick Alley, DO  doxycycline (VIBRAMYCIN) 100 MG capsule Take 1 capsule (100 mg total) by mouth 2 (two) times daily. Patient not taking: Reported on 08/04/2021 03/06/21   Tomi Bamberger, PA-C  fluticasone [facility]) 50 MCG/ACT nasal spray Place 2 sprays into both nostrils daily. 09/29/22   Shelby Mattocks, DO  fluvoxaMINE (LUVOX) 100 MG tablet Take 100 mg by mouth in the  morning and at bedtime.    [provider]  hydrOXYzine (ATARAX) 25 MG tablet Take 25 mg by mouth 3 (three) times daily. 08/02/21   [provider]  melatonin 3 MG TABS tablet Take 1 tablet (3 mg total) by mouth at bedtime. 01/28/22   Shelby Mattocks, DO  vitamin D3 (CHOLECALCIFEROL) 25 MCG tablet Take 1 tablet (1,000 Units total) by mouth daily. 10/17/22   Erick Alley, DO      Allergies    Patient has no known allergies.    Review of Systems   Review of Systems  Psychiatric/Behavioral:  Positive for behavioral problems and suicidal ideas. Negative for hallucinations.     Physical Exam Updated Vital Signs BP (!) 115/91 (BP Location: Left Arm)   Pulse (!) 122   Temp 98.3 F (36.8 C) (Oral)   Resp 18   SpO2 97%  Physical Exam Vitals and nursing note reviewed.  Constitutional:      General: He is not in acute distress.    Appearance: He is well-developed.  HENT:     Head: Normocephalic and atraumatic.  Eyes:     Conjunctiva/sclera: Conjunctivae normal.  Cardiovascular:     Rate and Rhythm: Normal rate and regular rhythm.     Heart sounds: No murmur heard. Pulmonary:     Effort: Pulmonary effort is normal. No respiratory distress.  Breath sounds: Normal breath sounds.  Abdominal:     Palpations: Abdomen is soft.     Tenderness: There is no abdominal tenderness.  Musculoskeletal:        General: No swelling.     Cervical back: Neck supple.  Skin:    General: Skin is warm and dry.     Capillary Refill: Capillary refill takes less than 2 seconds.  Neurological:     Mental Status: He is alert.  Psychiatric:        Mood and Affect: Mood normal.     ED Results / Procedures / Treatments   Labs (all labs ordered are listed, but only abnormal results are displayed) Labs Reviewed  COMPREHENSIVE METABOLIC PANEL - Abnormal; Notable for the following components:      Result Value   Glucose, Bld 125 (*)    Total Protein 8.4 (*)    AST 44 (*)    ALT 69 (*)     All other components within normal limits  SALICYLATE LEVEL - Abnormal; Notable for the following components:   Salicylate Lvl <7.0 (*)    All other components within normal limits  ACETAMINOPHEN LEVEL - Abnormal; Notable for the following components:   Acetaminophen (Tylenol), Serum <10 (*)    All other components within normal limits  CBC - Abnormal; Notable for the following components:   RBC 6.75 (*)    Hemoglobin 18.4 (*)    HCT 55.3 (*)    All other components within normal limits  CBG MONITORING, ED - Abnormal; Notable for the following components:   Glucose-Capillary 138 (*)    All other components within normal limits  ETHANOL  RAPID URINE DRUG SCREEN, HOSP PERFORMED    EKG None  Radiology No results found.  Procedures Procedures    Medications Ordered in ED Medications - No data to display  ED Course/ Medical Decision Making/ A&P                             Medical Decision Making Patient is a 22 year old male, here for SI, he states that he was previously suicidal, was barricading himself in his room he is someone at his group home distantly he did not like.  He denies any SI, HI, AVH at this time.  He denies any kind of SI with plan.  We will medically clear, and then have psychiatry evaluate given his multiple mental health problems.  Amount and/or Complexity of Data Reviewed Labs: ordered.    Details: Mild transaminitis, and elevated hemoglobin, elevated hemoglobin found in the past Discussion of management or test interpretation with external provider(s): No acute findings on medical workup, we will have psychiatry further evaluate, he is medically cleared.  He denies any pain, did not hit his head when throwing things, or destroying things.  Has no complaints.  Psychiatry to further evaluate.   Final Clinical Impression(s) / ED Diagnoses Final diagnoses:  Suicidal ideation    Rx / DC Orders ED Discharge Orders     None         Capricia Serda,  [place], PA 11/09/22 2130    Bethann Berkshire, MD 11/11/22 1355
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
## 2022-11-10 ENCOUNTER — Ambulatory Visit (HOSPITAL_COMMUNITY)
Admission: EM | Admit: 2022-11-10 | Discharge: 2022-11-11 | Disposition: A | Discharge status: Home / Self Care | Payer: Medicaid Other | Attending: Urology | Admitting: Urology

## 2022-11-10 DIAGNOSIS — R45850 Homicidal ideations: Secondary | ICD-10-CM | POA: Insufficient documentation

## 2022-11-10 DIAGNOSIS — F840 Autistic disorder: Secondary | ICD-10-CM | POA: Insufficient documentation

## 2022-11-10 DIAGNOSIS — F909 Attention-deficit hyperactivity disorder, unspecified type: Secondary | ICD-10-CM | POA: Insufficient documentation

## 2022-11-10 MED ORDER — CHLORPROMAZINE HCL 25 MG/ML IJ SOLN: 50.0000 mg | Freq: Four times a day (QID) | INTRAMUSCULAR | Status: DC | PRN

## 2022-11-10 MED ORDER — LORAZEPAM 1 MG PO TABS
2.0000 mg | ORAL_TABLET | Freq: Four times a day (QID) | ORAL | Status: DC | PRN
  Administered 2022-11-10: 2 mg via ORAL
  Filled 2022-11-10: qty 2

## 2022-11-10 MED ORDER — TRAZODONE HCL 50 MG PO TABS: 50.0000 mg | ORAL_TABLET | Freq: Every evening | ORAL | Status: DC | PRN

## 2022-11-10 MED ORDER — LORAZEPAM 1 MG PO TABS: 2.0000 mg | ORAL_TABLET | Freq: Four times a day (QID) | ORAL | Status: DC | PRN

## 2022-11-10 MED ORDER — FLUVOXAMINE MALEATE 50 MG PO TABS
100.0000 mg | ORAL_TABLET | Freq: Two times a day (BID) | ORAL | Status: DC
  Administered 2022-11-10 – 2022-11-11 (×3): 100 mg via ORAL
  Filled 2022-11-10 (×3): qty 2

## 2022-11-10 MED ORDER — MAGNESIUM HYDROXIDE 400 MG/5ML PO SUSP: 30.0000 mL | Freq: Every day | ORAL | Status: DC | PRN

## 2022-11-10 MED ORDER — DIPHENHYDRAMINE HCL 50 MG/ML IJ SOLN: 50.0000 mg | Freq: Four times a day (QID) | INTRAMUSCULAR | Status: DC | PRN

## 2022-11-10 MED ORDER — CHLORPROMAZINE HCL 50 MG PO TABS
75.0000 mg | ORAL_TABLET | Freq: Every day | ORAL | Status: DC
  Administered 2022-11-10: 75 mg via ORAL
  Filled 2022-11-10: qty 1

## 2022-11-10 MED ORDER — MELATONIN 3 MG PO TABS
3.0000 mg | ORAL_TABLET | Freq: Every day | ORAL | Status: DC
  Administered 2022-11-10: 3 mg via ORAL
  Filled 2022-11-10: qty 1

## 2022-11-10 MED ORDER — ALUM & MAG HYDROXIDE-SIMETH 200-200-20 MG/5ML PO SUSP: 30.0000 mL | ORAL | Status: DC | PRN

## 2022-11-10 MED ORDER — HYDROXYZINE HCL 25 MG PO TABS
25.0000 mg | ORAL_TABLET | Freq: Three times a day (TID) | ORAL | Status: DC
  Administered 2022-11-10 – 2022-11-11 (×4): 25 mg via ORAL
  Filled 2022-11-10 (×4): qty 1

## 2022-11-10 MED ORDER — CHLORPROMAZINE HCL 25 MG PO TABS
25.0000 mg | ORAL_TABLET | Freq: Two times a day (BID) | ORAL | Status: DC
  Administered 2022-11-10: 25 mg via ORAL
  Filled 2022-11-10: qty 1

## 2022-11-10 MED ORDER — ATOMOXETINE HCL 40 MG PO CAPS
40.0000 mg | ORAL_CAPSULE | Freq: Every morning | ORAL | Status: DC
  Administered 2022-11-10 – 2022-11-11 (×2): 40 mg via ORAL
  Filled 2022-11-10 (×3): qty 1

## 2022-11-10 MED ORDER — CHLORPROMAZINE HCL 50 MG PO TABS
50.0000 mg | ORAL_TABLET | Freq: Four times a day (QID) | ORAL | Status: DC | PRN
  Administered 2022-11-11: 50 mg via ORAL
  Filled 2022-11-10: qty 1

## 2022-11-10 MED ORDER — DIPHENHYDRAMINE HCL 50 MG PO CAPS: 50.0000 mg | ORAL_CAPSULE | Freq: Four times a day (QID) | ORAL | Status: DC | PRN

## 2022-11-10 MED ORDER — ACETAMINOPHEN 325 MG PO TABS: 650.0000 mg | ORAL_TABLET | Freq: Four times a day (QID) | ORAL | Status: DC | PRN

## 2022-11-10 MED ORDER — CHLORPROMAZINE HCL 50 MG PO TABS
50.0000 mg | ORAL_TABLET | Freq: Every morning | ORAL | Status: DC
  Administered 2022-11-11: 50 mg via ORAL
  Filled 2022-11-10: qty 1

## 2022-11-10 MED ORDER — HYDROXYZINE HCL 25 MG PO TABS
25.0000 mg | ORAL_TABLET | Freq: Three times a day (TID) | ORAL | Status: DC | PRN
  Administered 2022-11-10: 25 mg via ORAL
  Filled 2022-11-10: qty 1

## 2022-11-10 NOTE — ED Notes (Signed)
Safe transport set up for pt.  

## 2022-11-10 NOTE — ED Notes (Signed)
Patient up staring at the ceiling, continues to look unhappy. Vistaril 25mg  given at this time. Will continue to monitor for safety.
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
## 2022-11-10 NOTE — ED Provider Notes (Addendum)
Behavioral Health Progress Note  Date and Time: 11/10/2022 6:16 PM Name: Ray Carter MRN:  [id number]   Ray Carter is a 22 yo male with a hisotry of ADS, ODD, and ADHD, living in a group home, arriving from [facility] via [employer] for making suicidal and homicidal remarks towards staff. Currently undergoing continuous assessment at [facility] [place].   Subjective:   On assessment patient states "I do not want to talk to you".  Patient was witnessed making inappropriate remarks in the presence of other patients, stating "I am a white supremacist, I will call the [employer] and have you all killed".  Patient was educated on the importance of maintaining respect for other patients on the unit.  He is not willing to share the events leading to his hospitalization, when asked to corroborate documentation from previous encounters he states that "those are lies".  He requests to be taken back to an autism unit in [place] called [name], reports that he was discharged from there at age 15.  Patient has a biological family in [place] and his adopted family is in [place].  Remarks that his adoptive family does not care about him.  Patient threatens to make continued homicidal and suicidal remarks towards staff if he is discharged.   He is able to recall some of his current home medications which include Thorazine and Strattera.  He does report that Thorazine helps with his agitation and is amenable to starting this on the unit.  He reports previous psychiatric diagnoses of MDD, OCD, ADHD, autism, and ODD.  Patient clarifies that although he currently lives in a group home he does not have a legal guardian.  However when discussing options for discharge, he is only able to identify a staff member within the group home that he could live with.  Clarified to patient that this is not an appropriate disposition and he denies ever stayed with a staff member.  On evaluation, patient presently denies homicidal ideation.  He  denies suicidal ideation to this interviewer.  He also denies auditory and visual hallucinations.  He denies paranoid ideations.   Patient was later evaluated in the afternoon after restarting his home medications, appeared calmer and more amenable to discharging back to group home.     Collateral Call: Contacted Group Home at [phone] and spoke to [facility], the owner of the facility.  She reports that the patient often exhibits attention seeking behavior, is well-known to [employer] as he often calls them when he feels like he is not getting enough attention.  She denies any history of violence towards others, however the patient will often go to his room and start  banging on the walls. They are willing to accept him back at the group home once he is stable.  Confirmed with facility patient's current home medications which include: - Strattera 40 mg every morning - BuSpar 15 mg 3 times daily - Thorazine 25 mg every morning + 75 mg nightly - Fluvoxamine 100 mg twice daily - Atarax 25 mg 3 times daily - PRNs: Zyrtec, Flonase, stool softener, melatonin    Diagnosis:  Final diagnoses:  Attention deficit hyperactivity disorder (ADHD), unspecified ADHD type    Total Time spent with patient: 45 minutes  Additional Social History:                         Sleep: Fair  Appetite:  Fair  Current Medications:  Current Facility-Administered Medications  Medication Dose Route  Frequency Provider Last Rate Last Admin   acetaminophen (TYLENOL) tablet 650 mg  650 mg Oral Q6H PRN Ajibola, [name] A, NP       alum & mag hydroxide-simeth (MAALOX/MYLANTA) 200-200-20 MG/5ML suspension 30 mL  30 mL Oral Q4H PRN Ajibola, [name] A, NP       atomoxetine (STRATTERA) capsule 40 mg  40 mg Oral q AM Mariel Craft, MD   40 mg at 11/10/22 1150   chlorproMAZINE (THORAZINE) injection 50 mg  50 mg Intramuscular QID PRN Mariel Craft, MD       Or   chlorproMAZINE (THORAZINE) tablet 50 mg  50 mg  Oral QID PRN Mariel Craft, MD       [START ON 11/11/2022] chlorproMAZINE (THORAZINE) tablet 50 mg  50 mg Oral q AM Carrion-Carrero, Zakaiya Lares, MD       And   chlorproMAZINE (THORAZINE) tablet 75 mg  75 mg Oral QHS Carrion-Carrero, Quinton Voth, MD       diphenhydrAMINE (BENADRYL) capsule 50 mg  50 mg Oral Q6H PRN Mariel Craft, MD       Or   diphenhydrAMINE (BENADRYL) injection 50 mg  50 mg Intramuscular Q6H PRN Mariel Craft, MD       fluvoxaMINE (LUVOX) tablet 100 mg  100 mg Oral BID Mariel Craft, MD   100 mg at 11/10/22 1150   hydrOXYzine (ATARAX) tablet 25 mg  25 mg Oral TID Mariel Craft, MD   25 mg at 11/10/22 1624   LORazepam (ATIVAN) tablet 2 mg  2 mg Oral Q6H PRN Mariel Craft, MD       Or   LORazepam (ATIVAN) tablet 2 mg  2 mg Oral Q6H PRN Mariel Craft, MD       magnesium hydroxide (MILK OF MAGNESIA) suspension 30 mL  30 mL Oral Daily PRN Ajibola, [name] A, NP       melatonin tablet 3 mg  3 mg Oral QHS Mariel Craft, MD       traZODone (DESYREL) tablet 50 mg  50 mg Oral QHS PRN Ajibola, [name] A, NP       Current Outpatient Medications  Medication Sig Dispense Refill   atomoxetine (STRATTERA) 40 MG capsule Take 40 mg by mouth in the morning.     busPIRone (BUSPAR) 15 MG tablet Take 15 mg by mouth 3 (three) times daily.     cetirizine (ZYRTEC) 10 MG tablet Take 1 tablet (10 mg total) by mouth daily. Please make appointment for further refills. 30 tablet 11   chlorproMAZINE (THORAZINE) 25 MG tablet Take 25 mg by mouth 2 (two) times daily.     docusate sodium (STOOL SOFTENER) 100 MG capsule Take 1 capsule (100 mg total) by mouth daily. 30 capsule 11   fluticasone (FLONASE) 50 MCG/ACT nasal spray Place 2 sprays into both nostrils daily. 16 g 3   fluvoxaMINE (LUVOX) 100 MG tablet Take 100 mg by mouth 2 (two) times daily.     hydrOXYzine (ATARAX) 25 MG tablet Take 25 mg by mouth 3 (three) times daily.     melatonin 3 MG TABS tablet Take 1 tablet (3 mg total) by mouth at  bedtime. 30 tablet 11   vitamin D3 (CHOLECALCIFEROL) 25 MCG tablet Take 1 tablet (1,000 Units total) by mouth daily. 30 tablet 11    Labs  Lab Results:  Admission on 11/09/2022, Discharged on 11/10/2022  Component Date Value Ref Range Status   Sodium 11/09/2022 138  135 -  145 mmol/L Final   Potassium 11/09/2022 4.1  3.5 - 5.1 mmol/L Final   HEMOLYSIS AT THIS LEVEL MAY AFFECT RESULT   Chloride 11/09/2022 104  98 - 111 mmol/L Final   CO2 11/09/2022 23  22 - 32 mmol/L Final   Glucose, Bld 11/09/2022 125 (H)  70 - 99 mg/dL Final   Glucose reference range applies only to samples taken after fasting for at least 8 hours.   BUN 11/09/2022 11  6 - 20 mg/dL Final   Creatinine, Ser 11/09/2022 1.11  0.61 - 1.24 mg/dL Final   Calcium 16/03/9603 9.2  8.9 - 10.3 mg/dL Final   Total Protein 54/02/8118 8.4 (H)  6.5 - 8.1 g/dL Final   Albumin 14/78/2956 4.9  3.5 - 5.0 g/dL Final   AST 21/30/8657 44 (H)  15 - 41 U/L Final   HEMOLYSIS AT THIS LEVEL MAY AFFECT RESULT   ALT 11/09/2022 69 (H)  0 - 44 U/L Final   HEMOLYSIS AT THIS LEVEL MAY AFFECT RESULT   Alkaline Phosphatase 11/09/2022 81  38 - 126 U/L Final   Total Bilirubin 11/09/2022 1.2  0.3 - 1.2 mg/dL Final   HEMOLYSIS AT THIS LEVEL MAY AFFECT RESULT   GFR, Estimated 11/09/2022 >60  >60 mL/min Final   Comment: (NOTE) Calculated using the CKD-EPI Creatinine Equation (2021)    Anion gap 11/09/2022 11  5 - 15 Final   Performed at [facility], [address], [place], [place] [address]   Alcohol, Ethyl (B) 11/09/2022 <10  <10 mg/dL Final   Comment: (NOTE) Lowest detectable limit for serum alcohol is 10 mg/dL.  For medical purposes only. Performed at [facility], [address], [place], [place] [address]    Salicylate Lvl 11/09/2022 <7.0 (L)  7.0 - 30.0 mg/dL Final   Performed at [facility], [address], , [place] [address]   Acetaminophen (Tylenol), Serum 11/09/2022 <10  (L)  10 - 30 ug/mL Final   Comment: (NOTE) Therapeutic concentrations vary significantly. A range of 10-30 ug/mL  may be an effective concentration for many patients. However, some  are best treated at concentrations outside of this range. Acetaminophen concentrations >150 ug/mL at 4 hours after ingestion  and >50 ug/mL at 12 hours after ingestion are often associated with  toxic reactions.  Performed at [facility], [address], [place], [place] [address]    WBC 11/09/2022 7.4  4.0 - 10.5 K/uL Final   RBC 11/09/2022 6.75 (H)  4.22 - 5.81 MIL/uL Final   Hemoglobin 11/09/2022 18.4 (H)  13.0 - 17.0 g/dL Final   HCT 72/53/6644 55.3 (H)  39.0 - 52.0 % Final   MCV 11/09/2022 81.9  80.0 - 100.0 fL Final   MCH 11/09/2022 27.3  26.0 - 34.0 pg Final   MCHC 11/09/2022 33.3  30.0 - 36.0 g/dL Final   RDW 03/47/4259 13.1  11.5 - 15.5 % Final   Platelets 11/09/2022 311  150 - 400 K/uL Final   nRBC 11/09/2022 0.0  0.0 - 0.2 % Final   Performed at [facility], [address], [place], [place] [address]   Opiates 11/09/2022 NONE DETECTED  NONE DETECTED Final   Cocaine 11/09/2022 NONE DETECTED  NONE DETECTED Final   Benzodiazepines 11/09/2022 NONE DETECTED  NONE DETECTED Final   Amphetamines 11/09/2022 NONE DETECTED  NONE DETECTED Final   Tetrahydrocannabinol 11/09/2022 NONE DETECTED  NONE DETECTED Final   Barbiturates 11/09/2022 NONE DETECTED  NONE DETECTED Final   Comment: (NOTE) DRUG SCREEN FOR MEDICAL PURPOSES ONLY.  IF CONFIRMATION IS NEEDED FOR ANY PURPOSE, NOTIFY LAB WITHIN 5 DAYS.  LOWEST DETECTABLE LIMITS FOR URINE DRUG SCREEN Drug Class                     Cutoff (ng/mL) Amphetamine and metabolites    1000 Barbiturate and metabolites    200 Benzodiazepine                 200 Opiates and metabolites        300 Cocaine and metabolites        300 THC                            50 Performed at [facility], [address], [place], [place] [address]    Glucose-Capillary 11/09/2022 138 (H)  70 - 99 mg/dL Final   Glucose reference range applies only to samples taken after fasting for at least 8 hours.  Admission on 07/16/2022, Discharged on 07/17/2022  Component Date Value Ref Range Status   Sodium 07/16/2022 139  135 - 145 mmol/L Final   Potassium 07/16/2022 3.9  3.5 - 5.1 mmol/L Final   Chloride 07/16/2022 104  98 - 111 mmol/L Final   CO2 07/16/2022 25  22 - 32 mmol/L Final   Glucose, Bld 07/16/2022 88  70 - 99 mg/dL Final   Glucose reference range applies only to samples taken after fasting for at least 8 hours.   BUN 07/16/2022 20  6 - 20 mg/dL Final   Creatinine, Ser 07/16/2022 1.19  0.61 - 1.24 mg/dL Final   Calcium 60/45/4098 9.3  8.9 - 10.3 mg/dL Final   GFR, Estimated 07/16/2022 >60  >60 mL/min Final   Comment: (NOTE) Calculated using the CKD-EPI Creatinine Equation (2021)    Anion gap 07/16/2022 10  5 - 15 Final   Performed at [facility], [address] [place], [place] [address]   WBC 07/16/2022 6.6  4.0 - 10.5 K/uL Final   RBC 07/16/2022 6.35 (H)  4.22 - 5.81 MIL/uL Final   Hemoglobin 07/16/2022 17.6 (H)  13.0 - 17.0 g/dL Final   HCT 78/29/5621 53.6 (H)  39.0 - 52.0 % Final   MCV 07/16/2022 84.4  80.0 - 100.0 fL Final   MCH 07/16/2022 27.7  26.0 - 34.0 pg Final   MCHC 07/16/2022 32.8  30.0 - 36.0 g/dL Final   RDW 30/86/5784 13.2  11.5 - 15.5 % Final   Platelets 07/16/2022 241  150 - 400 K/uL Final   nRBC 07/16/2022 0.0  0.0 - 0.2 % Final   Performed at [facility], [address], [place], [place] [address]   Troponin I (High Sensitivity) 07/16/2022 <2  <18 ng/L Final   Comment: (NOTE) Elevated high sensitivity troponin I (hsTnI) values and significant  changes across serial measurements may suggest ACS but many other  chronic and acute conditions are known to elevate hsTnI results.  Refer to the "Links" section for chest pain algorithms and  additional  guidance. Performed at [facility], [address], [place], [place] [address]     Blood Alcohol level:  Lab Results  Component Value Date   ETH <10 11/09/2022   ETH <10 04/30/2022    Metabolic Disorder Labs: No results found for: "HGBA1C", "MPG" No results found for: "PROLACTIN" No results  found for: "CHOL", "TRIG", "HDL", "CHOLHDL", "VLDL", "LDLCALC"  Therapeutic Lab Levels: No results found for: "LITHIUM" No results found for: "VALPROATE" No results found for: "CBMZ"  Physical Findings   PHQ2-9    Flowsheet Row Office Visit from 07/13/2020 in [facility] Office Visit from 06/17/2019 in [facility]  PHQ-2 Total Score 0 0  PHQ-9 Total Score 0 --      Flowsheet Row ED from 11/10/2022 in [facility] ED from 11[facility]09/2022 in [facility] Emergency Department at [facility] ED from 07/16/2022 in [facility] Emergency Department at [facility]  C-SSRS RISK CATEGORY No Risk Low Risk No Risk        Musculoskeletal  Strength & Muscle Tone: within normal limits Gait & Station: normal Patient leans: N/A  Psychiatric Specialty Exam  Presentation  General Appearance:  Appropriate for Environment; Casual  Eye Contact: Fleeting  Speech: Clear and Coherent  Speech Volume: Normal  Handedness: Right   Mood and Affect  Mood: -- ("this is bullshit")  Affect: Flat   Thought Process  Thought Processes: Linear  Descriptions of Associations:Intact  Orientation:Full (Time, Place and Person)  Thought Content:Rumination  Diagnosis of Schizophrenia or Schizoaffective disorder in past: No    Hallucinations:Hallucinations: None  Ideas of Reference:None  Suicidal Thoughts:Suicidal Thoughts: No  Homicidal Thoughts:Homicidal Thoughts: No (Denies active HI but threatens to make homicidal remarks if he doesn't get what he wants.)   Sensorium   Memory: Immediate Fair; Recent Fair; Remote Poor  Judgment: Poor  Insight: Lacking   Executive Functions  Concentration: Poor  Attention Span: Poor  Recall: [employer] of Knowledge: Fair  Language: Fair   Psychomotor Activity  Psychomotor Activity: Psychomotor Activity: Normal   Assets  Assets: Social Support; Physical Health; Housing   Sleep  Sleep: Sleep: Good Number of Hours of Sleep: 11   Nutritional Assessment (For OBS and FBC admissions only) Has the patient had a weight loss or gain of 10 pounds or more in the last 3 months?: No Has the patient had a decrease in food intake/or appetite?: No Does the patient have dental problems?: No Does the patient have eating habits or behaviors that may be indicators of an eating disorder including binging or inducing vomiting?: No Has the patient recently lost weight without trying?: 0 Has the patient been eating poorly because of a decreased appetite?: 0 Malnutrition Screening Tool Score: 0    Physical Exam  Physical Exam Constitutional:      General: He is not in acute distress.    Appearance: Normal appearance. He is not ill-appearing.  HENT:     Head: Normocephalic and atraumatic.  Pulmonary:     Effort: Pulmonary effort is normal. No respiratory distress.  Skin:    General: Skin is warm and dry.  Neurological:     Mental Status: He is alert.  Psychiatric:        Attention and Perception: He does not perceive auditory or visual hallucinations.        Mood and Affect: Affect is angry.        Speech: Speech normal.        Behavior: Behavior is uncooperative and agitated.        Thought Content: Thought content is not paranoid or delusional. Thought content does not include homicidal ideation. Thought content does not include homicidal or suicidal plan.        Judgment: Judgment is impulsive and inappropriate.  Review of Systems  Constitutional:  Negative for chills and fever.  Respiratory:   Negative for shortness of breath.   Cardiovascular:  Negative for palpitations.  Gastrointestinal:  Negative for abdominal pain, constipation and diarrhea.  Psychiatric/Behavioral:  Negative for depression, hallucinations, substance abuse and suicidal ideas. The patient is not nervous/anxious and does not have insomnia.    Blood pressure 117/89, pulse 84, temperature 98 F (36.7 C), temperature source Oral, resp. rate 16, SpO2 100 %. There is no height or weight on file to calculate BMI.   Treatment Plan Summary: Daily contact with patient to assess and evaluate symptoms and progress in treatment and Medication management.  Recommend continuous assessment overnight and possible discharge back to group home tomorrow morning.  Scheduled medications Start Strattera 40 mg every morning for ADHD Home thorazine was modified below Received Thorazine 25 mg today  Scheduled additional 75 mg tonight Start tomorrow: Thorazine to 50 mg every morning +75 mg nightly for agitation and impulsivity Restart home fluvoxamine 100 mg twice daily for impulsivity Restart home Atarax 25 mg 3 times daily for anxiety Start scheduled melatonin 3 mg for insomnia  Lorri Frederick, MD 11/10/2022 6:16 PM

## 2022-11-10 NOTE — ED Notes (Signed)
Pt awake,he is calm and cooperative. No c/o pain or distress. Will continue to monitor for safety

## 2022-11-10 NOTE — ED Notes (Signed)
Patient admitted from [facility] for observation after calling [employer] and endorsing having thoughts of hurting people. On arrival, patient denies SI, HI & AVH. Patient has flat affect and does not look happy. Cooperated with assessments. Oriented to unit and verbalized understanding. Patient contracted for safety verbally. Will continue to monitor for safety.

## 2022-11-10 NOTE — ED Notes (Addendum)
Report called to [facility]

## 2022-11-10 NOTE — ED Notes (Signed)
Provider went over each med with patient and ordered them. This nurse requested that pharmacy approve them. Patient refused to accept meds after receiving approval. All other patients removed from observation area. Several staff members attempting to get patient to move to the flex unit. Patient stating that he is going to break his hands, staff encouraging patient to not entertain this behavior. Patient began to bang his head on the door after banging his fist on the nurses station. Patient eventually walked into the flex unit. After entering the flex unit patient began to bang his head on the wall and attempt to open the locked door. Patient continues to refuse scheduled meds that he recently requested.

## 2022-11-10 NOTE — ED Notes (Signed)
Pt is in the bed sleeping. Respirations are even and unlabored. No acute distress noted. Will continue to monitor for safety. 

## 2022-11-10 NOTE — ED Notes (Signed)
Patient is pacing the unit. Eager to see a provider. Continues to bang on the nurses desk even after staff has requested for him to stop. Ignoring staff when when they speak to him. Safety maintained and will continue to monitor.

## 2022-11-10 NOTE — ED Provider Notes (Signed)
[facility] Continuous Assessment Admission H&P  Date: 11/10/22 Patient Name: Ray Carter MRN: [id number] Chief Complaint: " Suicidal or homicidal ideation"  Diagnoses:  Final diagnoses:  Attention deficit hyperactivity disorder (ADHD), unspecified ADHD type    HPI: Ray Carter patient is a 22 year old male with history of autism spectrum disorder and ADHD. Patient initially presented voluntarily to [facility], ED reporting suicidal and homicidal ideation. patient was evaluated and recommended for continuous assessment here at [facility].  Patient was seen face-to-face and his chart was reviewed upon his arrival to [facility].  On approach, patient is alert and oriented x 4. Patient did not appear to be in any acute distress. Patient is irritable and uncooperative.  He says "I do not want to be here, I want to just go to [place] and live with her mom.  I do not want to go back to the group home." he says he does not feel like talking and would not respond to further assessment questions asked by this provider.  Patient shook his  head "no" when asked about active suicidal and homicidal ideation. .    Total Time spent with patient: 15 minutes  Musculoskeletal  Strength & Muscle Tone: within normal limits Gait & Station: normal Patient leans: N/A  Psychiatric Specialty Exam  Presentation General Appearance:  Appropriate for Environment  Eye Contact: Good  Speech: Clear and Coherent  Speech Volume: Normal  Handedness: Right   Mood and Affect  Mood: Irritable  Affect: Appropriate   Thought Process  Thought Processes: Coherent  Descriptions of Associations:Intact  Orientation:Full (Time, Place and Person)  Thought Content:WDL  Diagnosis of Schizophrenia or Schizoaffective disorder in past: No   Hallucinations:Hallucinations: None  Ideas of Reference:None  Suicidal Thoughts:Suicidal Thoughts: No  Homicidal Thoughts:Homicidal Thoughts: No   Sensorium   Memory: Immediate Good; Recent Good  Judgment: Fair  Insight: Fair   Art therapist  Concentration: -- (uncooperative)  Attention Span: -- (uncooperative)  Recall: -- (uncooperative)  Fund of Knowledge: -- (uncooperative)  Language: -- (uncooperative)   Psychomotor Activity  Psychomotor Activity:Psychomotor Activity: Normal   Assets  Assets: Physical Health   Sleep  Sleep:Sleep: Good Number of Hours of Sleep: 11   Nutritional Assessment (For OBS and FBC admissions only) Has the patient had a weight loss or gain of 10 pounds or more in the last 3 months?: -- (pt refused to answer) Has the patient had a decrease in food intake/or appetite?: -- (pt refused to answer) Does the patient have dental problems?: -- (pt refused to answer) Does the patient have eating habits or behaviors that may be indicators of an eating disorder including binging or inducing vomiting?: -- (pt refused to answer) Has the patient recently lost weight without trying?: -- (pt refused to answer) Has the patient been eating poorly because of a decreased appetite?: -- (pt refused to answer)    Physical Exam ROS  Blood pressure 127/86, pulse 94, temperature 98.7 F (37.1 C), temperature source Oral, resp. rate 18, SpO2 99 %. There is no height or weight on file to calculate BMI.  Past Psychiatric History:  Past Medical History:  Diagnosis Date   Adjustment disorder with mixed disturbance of emotions and conduct    Anxiety    Attention deficit hyperactivity disorder (ADHD), combined type    Autism    Disruptive mood dysregulation disorder (HCC)    History of Aggressive behavior       Is the patient at risk to self? No  Has  the patient been a risk to self in the past 6 months? Yes .    Has the patient been a risk to self within the distant past? Yes   Is the patient a risk to others? No   Has the patient been a risk to others in the past 6 months? Yes   Has the patient  been a risk to others within the distant past? Yes   Past Medical History:  Past Medical History:  Diagnosis Date   Adjustment disorder with mixed disturbance of emotions and conduct    Anxiety    Attention deficit hyperactivity disorder (ADHD), combined type    Autism    Disruptive mood dysregulation disorder (HCC)    History of Aggressive behavior      Family History:  Family History  Adopted: Yes     Social History:  Social History   Tobacco Use   Smoking status: Never   Smokeless tobacco: Never  Vaping Use   Vaping Use: Never used  Substance Use Topics   Alcohol use: Never   Drug use: Never     Last Labs:  Admission on 11/09/2022, Discharged on 11/10/2022  Component Date Value Ref Range Status   Sodium 11/09/2022 138  135 - 145 mmol/L Final   Potassium 11/09/2022 4.1  3.5 - 5.1 mmol/L Final   HEMOLYSIS AT THIS LEVEL MAY AFFECT RESULT   Chloride 11/09/2022 104  98 - 111 mmol/L Final   CO2 11/09/2022 23  22 - 32 mmol/L Final   Glucose, Bld 11/09/2022 125 (H)  70 - 99 mg/dL Final   Glucose reference range applies only to samples taken after fasting for at least 8 hours.   BUN 11/09/2022 11  6 - 20 mg/dL Final   Creatinine, Ser 11/09/2022 1.11  0.61 - 1.24 mg/dL Final   Calcium 52/84/1324 9.2  8.9 - 10.3 mg/dL Final   Total Protein 40/03/2724 8.4 (H)  6.5 - 8.1 g/dL Final   Albumin 36/64/4034 4.9  3.5 - 5.0 g/dL Final   AST 74/25/9563 44 (H)  15 - 41 U/L Final   HEMOLYSIS AT THIS LEVEL MAY AFFECT RESULT   ALT 11/09/2022 69 (H)  0 - 44 U/L Final   HEMOLYSIS AT THIS LEVEL MAY AFFECT RESULT   Alkaline Phosphatase 11/09/2022 81  38 - 126 U/L Final   Total Bilirubin 11/09/2022 1.2  0.3 - 1.2 mg/dL Final   HEMOLYSIS AT THIS LEVEL MAY AFFECT RESULT   GFR, Estimated 11/09/2022 >60  >60 mL/min Final   Comment: (NOTE) Calculated using the CKD-EPI Creatinine Equation (2021)    Anion gap 11/09/2022 11  5 - 15 Final   Performed at [facility], [address] [place], [place] [address]   Alcohol, Ethyl (B) 11/09/2022 <10  <10 mg/dL Final   Comment: (NOTE) Lowest detectable limit for serum alcohol is 10 mg/dL.  For medical purposes only. Performed at [facility], [address], [place], [place] [address]    Salicylate Lvl 11/09/2022 <7.0 (L)  7.0 - 30.0 mg/dL Final   Performed at [facility], [address] [place], [place] [address]   Acetaminophen (Tylenol), Serum 11/09/2022 <10 (L)  10 - 30 ug/mL Final   Comment: (NOTE) Therapeutic concentrations vary significantly. A range of 10-30 ug/mL  may be an effective concentration for many patients. However, some  are best treated at concentrations outside of this range. Acetaminophen concentrations >150 ug/mL at 4 hours after ingestion  and >50 ug/mL at 12 hours after ingestion are often associated with  toxic reactions.  Performed at [facility], [address], [place], [place] [address]    WBC 11/09/2022 7.4  4.0 - 10.5 K/uL Final   RBC 11/09/2022 6.75 (H)  4.22 - 5.81 MIL/uL Final   Hemoglobin 11/09/2022 18.4 (H)  13.0 - 17.0 g/dL Final   HCT 60/45/4098 55.3 (H)  39.0 - 52.0 % Final   MCV 11/09/2022 81.9  80.0 - 100.0 fL Final   MCH 11/09/2022 27.3  26.0 - 34.0 pg Final   MCHC 11/09/2022 33.3  30.0 - 36.0 g/dL Final   RDW 11/91/4782 13.1  11.5 - 15.5 % Final   Platelets 11/09/2022 311  150 - 400 K/uL Final   nRBC 11/09/2022 0.0  0.0 - 0.2 % Final   Performed at [facility], [address] [place], [place] [address]   Opiates 11/09/2022 NONE DETECTED  NONE DETECTED Final   Cocaine 11/09/2022 NONE DETECTED  NONE DETECTED Final   Benzodiazepines 11/09/2022 NONE DETECTED  NONE DETECTED Final   Amphetamines 11/09/2022 NONE DETECTED  NONE DETECTED Final   Tetrahydrocannabinol 11/09/2022 NONE DETECTED  NONE DETECTED Final   Barbiturates 11/09/2022 NONE DETECTED  NONE DETECTED Final   Comment:  (NOTE) DRUG SCREEN FOR MEDICAL PURPOSES ONLY.  IF CONFIRMATION IS NEEDED FOR ANY PURPOSE, NOTIFY LAB WITHIN 5 DAYS.  LOWEST DETECTABLE LIMITS FOR URINE DRUG SCREEN Drug Class                     Cutoff (ng/mL) Amphetamine and metabolites    1000 Barbiturate and metabolites    200 Benzodiazepine                 200 Opiates and metabolites        300 Cocaine and metabolites        300 THC                            50 Performed at [facility], [address] [place], [place] [address]    Glucose-Capillary 11/09/2022 138 (H)  70 - 99 mg/dL Final   Glucose reference range applies only to samples taken after fasting for at least 8 hours.  Admission on 07/16/2022, Discharged on 07/17/2022  Component Date Value Ref Range Status   Sodium 07/16/2022 139  135 - 145 mmol/L Final   Potassium 07/16/2022 3.9  3.5 - 5.1 mmol/L Final   Chloride 07/16/2022 104  98 - 111 mmol/L Final   CO2 07/16/2022 25  22 - 32 mmol/L Final   Glucose, Bld 07/16/2022 88  70 - 99 mg/dL Final   Glucose reference range applies only to samples taken after fasting for at least 8 hours.   BUN 07/16/2022 20  6 - 20 mg/dL Final   Creatinine, Ser 07/16/2022 1.19  0.61 - 1.24 mg/dL Final   Calcium 78/46/9629 9.3  8.9 - 10.3 mg/dL Final   GFR, Estimated 07/16/2022 >60  >60 mL/min Final   Comment: (NOTE) Calculated using the CKD-EPI Creatinine Equation (2021)    Anion gap 07/16/2022 10  5 - 15 Final   Performed at [facility], [address], [place], [place] [address]   WBC 07/16/2022 6.6  4.0 - 10.5 K/uL Final   RBC 07/16/2022 6.35 (H)  4.22 - 5.81 MIL/uL Final   Hemoglobin 07/16/2022 17.6 (H)  13.0 -  17.0 g/dL Final   HCT 16/03/9603 53.6 (H)  39.0 - 52.0 % Final   MCV 07/16/2022 84.4  80.0 - 100.0 fL Final   MCH 07/16/2022 27.7  26.0 - 34.0 pg Final   MCHC 07/16/2022 32.8  30.0 - 36.0 g/dL Final   RDW 54/02/8118 13.2  11.5 - 15.5 % Final   Platelets 07/16/2022 241  150 - 400  K/uL Final   nRBC 07/16/2022 0.0  0.0 - 0.2 % Final   Performed at [facility], [address] [place], [place] [address]   Troponin I (High Sensitivity) 07/16/2022 <2  <18 ng/L Final   Comment: (NOTE) Elevated high sensitivity troponin I (hsTnI) values and significant  changes across serial measurements may suggest ACS but many other  chronic and acute conditions are known to elevate hsTnI results.  Refer to the "Links" section for chest pain algorithms and additional  guidance. Performed at [facility], [address], [place], [place] [address]     Allergies: Patient has no known allergies.  Medications:  Facility Ordered Medications  Medication   acetaminophen (TYLENOL) tablet 650 mg   alum & mag hydroxide-simeth (MAALOX/MYLANTA) 200-200-20 MG/5ML suspension 30 mL   magnesium hydroxide (MILK OF MAGNESIA) suspension 30 mL   traZODone (DESYREL) tablet 50 mg   hydrOXYzine (ATARAX) tablet 25 mg   PTA Medications  Medication Sig   chlorproMAZINE (THORAZINE) 25 MG tablet Take 25 mg by mouth in the morning and at bedtime.   fluvoxaMINE (LUVOX) 100 MG tablet Take 100 mg by mouth in the morning and at bedtime.   atomoxetine (STRATTERA) 40 MG capsule Take 40 mg by mouth in the morning.   doxycycline (VIBRAMYCIN) 100 MG capsule Take 1 capsule (100 mg total) by mouth 2 (two) times daily. (Patient not taking: Reported on 08/04/2021)   busPIRone (BUSPAR) 15 MG tablet Take 15 mg by mouth 3 (three) times daily.   hydrOXYzine (ATARAX) 25 MG tablet Take 25 mg by mouth 3 (three) times daily.   chlorproMAZINE (THORAZINE) 50 MG tablet Take 50 mg by mouth daily.   cetirizine (ZYRTEC) 10 MG tablet Take 1 tablet (10 mg total) by mouth daily. Please make appointment for further refills.   melatonin 3 MG TABS tablet Take 1 tablet (3 mg total) by mouth at bedtime.   fluticasone (FLONASE) 50 MCG/ACT nasal spray Place 2 sprays into both nostrils daily.   vitamin D3  (CHOLECALCIFEROL) 25 MCG tablet Take 1 tablet (1,000 Units total) by mouth daily.   docusate sodium (STOOL SOFTENER) 100 MG capsule Take 1 capsule (100 mg total) by mouth daily.      Medical Decision Making  Patient will be admitted to [facility] for continuous assessment.    Recommendations  Based on my evaluation the patient does not appear to have an emergency medical condition.  Maricela Bo, NP 11/10/22  6:00 AM

## 2022-11-10 NOTE — ED Notes (Signed)
Patient accepted scheduled meds. Patient continues to hit the window with his fist and state, "I hate black men but not black women". Patient continues to demand to have his belongings from his locker. Multiple staff members have explained that he can not have his personal belongings on the unit. Safety maintained and will continue to monitor.

## 2022-11-10 NOTE — ED Notes (Signed)
Pt sleeping at present, no distress noted.  Monitoring for safety. 

## 2022-11-11 MED ORDER — CHLORPROMAZINE HCL 25 MG PO TABS: 75.0000 mg | ORAL_TABLET | Freq: Every day | ORAL | 0 refills | Status: DC

## 2022-11-11 MED ORDER — CHLORPROMAZINE HCL 50 MG PO TABS: 50.0000 mg | ORAL_TABLET | Freq: Every morning | ORAL | 0 refills | Status: DC

## 2022-11-11 NOTE — ED Notes (Signed)
Pt resting quietly, breathing even and unlabored.  

## 2022-11-11 NOTE — ED Notes (Addendum)
Pt resting quietly, breathing even and unlabored.  In view of nursing station and being monitored.  No distress noted.

## 2022-11-11 NOTE — ED Notes (Signed)
Pt sleeping@this time. Breathing even and unalabored. Will continue to monitor for safety 

## 2022-11-11 NOTE — ED Notes (Signed)
Patient was discharged back to his group home today by his provider. Patient was escorted and transported by a group home representative "Pat". Patient's discharge instructions and prescriptions were given to "Pat".

## 2022-11-11 NOTE — Discharge Instructions (Signed)
Dear Ray Carter,  It was a pleasure to take care of you during your stay at  Group Home  where you were treated for your ADHD.  While you were here, you were:  observed and cared for by our nurses and nursing assistants  treated with medications by your psychiatrists  provided resources by our social workers and case managers  Please review the medication list provided to you at discharge and stop, start taking, or continue taking the medications listed there.  You should also follow-up with your primary care doctor, or start seeing one if you don't have one yet. If applicable, here are some scheduled follow-ups for you:    I recommend abstinence from alcohol, tobacco, and other illicit drug use.   If your psychiatric symptoms or suicidal thoughts recur, worsen, or if you have side effects to your psychiatric medications, call your outpatient psychiatric provider, [phone] or go to the nearest emergency department.  Take care!  Signed: Lorri Frederick, MD 11/11/2022, 10:26 AM  Naloxone (Narcan) can help reverse an overdose when given to the victim quickly.  [place] offers free naloxone kits and instructions/training on its use.  Add naloxone to your first aid kit and you can help save a life. A prescription can be filled at your local pharmacy or free kits are provided by the county
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
## 2022-11-11 NOTE — ED Notes (Signed)
Pt was given cereal, muffin, and milk for breakfast.  

## 2022-11-14 ENCOUNTER — Ambulatory Visit (HOSPITAL_COMMUNITY)
Admission: EM | Admit: 2022-11-14 | Discharge: 2022-11-14 | Disposition: A | Discharge status: Home / Self Care | Payer: Medicaid Other | Attending: Family | Admitting: Family

## 2022-11-14 DIAGNOSIS — F4325 Adjustment disorder with mixed disturbance of emotions and conduct: Secondary | ICD-10-CM | POA: Insufficient documentation

## 2022-11-14 DIAGNOSIS — R45851 Suicidal ideations: Secondary | ICD-10-CM | POA: Insufficient documentation

## 2022-11-14 DIAGNOSIS — F32A Depression, unspecified: Secondary | ICD-10-CM | POA: Insufficient documentation

## 2022-11-14 DIAGNOSIS — G3184 Mild cognitive impairment, so stated: Secondary | ICD-10-CM | POA: Insufficient documentation

## 2022-11-14 DIAGNOSIS — R45850 Homicidal ideations: Secondary | ICD-10-CM | POA: Insufficient documentation

## 2022-11-14 NOTE — ED Notes (Signed)
Rn called police to take patient back to [address]
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
## 2022-11-14 NOTE — ED Notes (Signed)
Rn notified on coming nurses that patient was waiting on gpd to take him back to group home

## 2022-11-14 NOTE — Discharge Instructions (Addendum)

## 2022-11-14 NOTE — Progress Notes (Signed)
   11/14/22 1728  [facility] Triage Screening (Walk-ins at [facility] only)  How Did You Hear About [place]? Self  What Is the Reason for Your Visit/Call Today? Pt resides in a group home and stated he is here "for the same reason" he was here earlier this week. He stated that he was not going to talk and "answer our stupid questions" because he did not want to have a panic attack. Pt stated he could not remember if he has taken his medication today or not. Pt was irritable, guarded and rude. Pt stated he was having Si and HI but stated it might pass since he was here. Pt would not answer any further questions at this time.  How Long Has This Been Causing You Problems? > than 6 months  Have You Recently Had Any Thoughts About Hurting Yourself? Yes  How long ago did you have thoughts about hurting yourself? today- no details given  Are You Planning to Commit Suicide/Harm Yourself At This time? No  Have you Recently Had Thoughts About Hurting Someone [name]? Yes  How long ago did you have thoughts of harming others? today- no details given  Are You Planning To Harm Someone At This Time? No  Explanation: Pt refused to answer questions or dicuss his thoughts any further stan statement made.  Are you currently experiencing any auditory, visual or other hallucinations?  (unable to assess due to irritability)  Have You Used Any Alcohol or Drugs in the Past 24 Hours?  (unable to assess due to irritability)  What Did You Use and How Much? unable to assess due to irritability  Do you have any current medical co-morbidities that require immediate attention?  (unable to assess due to irritability)  Clinician description of patient physical appearance/behavior: irritable, rude, dismissive and guarded  What Do You Feel Would Help You the Most Today? Treatment for Depression or other mood problem  If access to [facility] was not available, would you have sought care in the Emergency Department? No  Determination of Need  Routine (7 days)  Options For Referral Medication Management;Outpatient Therapy

## 2022-11-15 ENCOUNTER — Emergency Department (HOSPITAL_COMMUNITY): Payer: Medicaid Other

## 2022-11-15 ENCOUNTER — Emergency Department (HOSPITAL_COMMUNITY)
Admission: EM | Admit: 2022-11-15 | Discharge: 2022-11-16 | Disposition: A | Discharge status: Home / Self Care | Payer: Medicaid Other | Attending: Emergency Medicine | Admitting: Emergency Medicine

## 2022-11-15 ENCOUNTER — Encounter (HOSPITAL_COMMUNITY): Payer: Self-pay

## 2022-11-15 ENCOUNTER — Other Ambulatory Visit: Payer: Self-pay

## 2022-11-15 DIAGNOSIS — G3184 Mild cognitive impairment, so stated: Secondary | ICD-10-CM | POA: Diagnosis present

## 2022-11-15 DIAGNOSIS — M79641 Pain in right hand: Secondary | ICD-10-CM | POA: Insufficient documentation

## 2022-11-15 DIAGNOSIS — F32A Depression, unspecified: Secondary | ICD-10-CM | POA: Insufficient documentation

## 2022-11-15 DIAGNOSIS — F840 Autistic disorder: Secondary | ICD-10-CM | POA: Insufficient documentation

## 2022-11-15 DIAGNOSIS — R45851 Suicidal ideations: Secondary | ICD-10-CM

## 2022-11-15 LAB — COMPREHENSIVE METABOLIC PANEL
ALT: 93 U/L — ABNORMAL HIGH (ref 0–44)
AST: 49 U/L — ABNORMAL HIGH (ref 15–41)
Albumin: 4.3 g/dL (ref 3.5–5.0)
Alkaline Phosphatase: 58 U/L (ref 38–126)
Anion gap: 9 (ref 5–15)
BUN: 12 mg/dL (ref 6–20)
CO2: 23 mmol/L (ref 22–32)
Calcium: 9.6 mg/dL (ref 8.9–10.3)
Chloride: 105 mmol/L (ref 98–111)
Creatinine, Ser: 1.15 mg/dL (ref 0.61–1.24)
GFR, Estimated: >60 mL/min (ref >60)
Glucose, Bld: 144 mg/dL — ABNORMAL HIGH (ref 70–99)
Potassium: 3.8 mmol/L (ref 3.5–5.1)
Sodium: 137 mmol/L (ref 135–145)
Total Bilirubin: 1.3 mg/dL — ABNORMAL HIGH (ref 0.3–1.2)
Total Protein: 7.5 g/dL (ref 6.5–8.1)

## 2022-11-15 LAB — CBC WITH DIFFERENTIAL/PLATELET
Abs Immature Granulocytes: 0.04 10*3/uL (ref 0.00–0.07)
Basophils Absolute: 0 10*3/uL (ref 0.0–0.1)
Basophils Relative: 1 %
Eosinophils Absolute: 0.1 10*3/uL (ref 0.0–0.5)
Eosinophils Relative: 1 %
HCT: 54.7 % — ABNORMAL HIGH (ref 39.0–52.0)
Hemoglobin: 18.2 g/dL — ABNORMAL HIGH (ref 13.0–17.0)
Immature Granulocytes: 1 %
Lymphocytes Relative: 25 %
Lymphs Abs: 2.2 10*3/uL (ref 0.7–4.0)
MCH: 27.2 pg (ref 26.0–34.0)
MCHC: 33.3 g/dL (ref 30.0–36.0)
MCV: 81.6 fL (ref 80.0–100.0)
Monocytes Absolute: 0.6 10*3/uL (ref 0.1–1.0)
Monocytes Relative: 7 %
Neutro Abs: 5.7 10*3/uL (ref 1.7–7.7)
Neutrophils Relative %: 65 %
Platelets: 251 10*3/uL (ref 150–400)
RBC: 6.7 MIL/uL — ABNORMAL HIGH (ref 4.22–5.81)
RDW: 13.2 % (ref 11.5–15.5)
WBC: 8.7 10*3/uL (ref 4.0–10.5)
nRBC: 0 % (ref 0.0–0.2)

## 2022-11-15 LAB — ETHANOL: Alcohol, Ethyl (B): <10 mg/dL (ref <10)

## 2022-11-15 LAB — ACETAMINOPHEN LEVEL: Acetaminophen (Tylenol), Serum: <10 ug/mL — ABNORMAL LOW (ref 10–30)

## 2022-11-15 LAB — SALICYLATE LEVEL: Salicylate Lvl: <7 mg/dL — ABNORMAL LOW (ref 7.0–30.0)

## 2022-11-15 MED ORDER — LORATADINE 10 MG PO TABS
10.0000 mg | ORAL_TABLET | Freq: Every day | ORAL | Status: DC
  Administered 2022-11-16: 10 mg via ORAL
  Filled 2022-11-15: qty 1

## 2022-11-15 MED ORDER — FLUVOXAMINE MALEATE 50 MG PO TABS
100.0000 mg | ORAL_TABLET | Freq: Two times a day (BID) | ORAL | Status: DC
  Administered 2022-11-15 – 2022-11-16 (×2): 100 mg via ORAL
  Filled 2022-11-15 (×2): qty 2

## 2022-11-15 MED ORDER — FLUTICASONE PROPIONATE 50 MCG/ACT NA SUSP
2.0000 | Freq: Every day | NASAL | Status: DC
  Filled 2022-11-15: qty 16

## 2022-11-15 MED ORDER — HYDROXYZINE HCL 25 MG PO TABS
25.0000 mg | ORAL_TABLET | Freq: Three times a day (TID) | ORAL | Status: DC
  Administered 2022-11-15 – 2022-11-16 (×2): 25 mg via ORAL
  Filled 2022-11-15 (×2): qty 1

## 2022-11-15 MED ORDER — CHLORPROMAZINE HCL 25 MG PO TABS
75.0000 mg | ORAL_TABLET | Freq: Two times a day (BID) | ORAL | Status: DC
  Administered 2022-11-15 – 2022-11-16 (×2): 75 mg via ORAL
  Filled 2022-11-15 (×2): qty 3

## 2022-11-15 MED ORDER — MELATONIN 3 MG PO TABS
3.0000 mg | ORAL_TABLET | Freq: Every day | ORAL | Status: DC
  Administered 2022-11-15: 3 mg via ORAL
  Filled 2022-11-15: qty 1

## 2022-11-15 MED ORDER — VITAMIN D 25 MCG (1000 UNIT) PO TABS
1000.0000 [IU] | ORAL_TABLET | Freq: Every day | ORAL | Status: DC
  Administered 2022-11-16: 1000 [IU] via ORAL
  Filled 2022-11-15: qty 1

## 2022-11-15 MED ORDER — BUSPIRONE HCL 10 MG PO TABS
15.0000 mg | ORAL_TABLET | Freq: Three times a day (TID) | ORAL | Status: DC
  Administered 2022-11-15 – 2022-11-16 (×2): 15 mg via ORAL
  Filled 2022-11-15 (×2): qty 2

## 2022-11-15 MED ORDER — DOCUSATE SODIUM 100 MG PO CAPS
100.0000 mg | ORAL_CAPSULE | Freq: Every day | ORAL | Status: DC
  Administered 2022-11-16: 100 mg via ORAL
  Filled 2022-11-15: qty 1

## 2022-11-15 MED ORDER — ATOMOXETINE HCL 40 MG PO CAPS
40.0000 mg | ORAL_CAPSULE | Freq: Every day | ORAL | Status: DC
  Administered 2022-11-16: 40 mg via ORAL
  Filled 2022-11-15: qty 1

## 2022-11-15 MED ORDER — CHLORPROMAZINE HCL 25 MG PO TABS
50.0000 mg | ORAL_TABLET | Freq: Every day | ORAL | Status: DC
  Administered 2022-11-15: 50 mg via ORAL
  Filled 2022-11-15: qty 2

## 2022-11-15 NOTE — ED Provider Notes (Signed)
[place] EMERGENCY DEPARTMENT AT [facility] Provider Note   CSN: [id number] Arrival date & time: 11/15/22  1309     History  Chief Complaint  Patient presents with   Suicidal    Suicidal Ideation and right hand laceration    Laceration    Ray Carter is a 22 y.o. male with a past medical history of anxiety, depression, ADHD presents today for evaluation of suicidal ideation.  States that he has thoughts of hurting himself.  Denies having any specific plan.  States that he has been depressed for a long time due to family issues.  Patient is living at group home. Denies thought of hurting other people.  Patient was found punching on a wall this morning states he is has had pain on the knuckles of the right hand.  Denies any hematoma or laceration.  Denies visual or auditory hallucinations.   Laceration     Past Medical History:  Diagnosis Date   Adjustment disorder with mixed disturbance of emotions and conduct    Anxiety    Attention deficit hyperactivity disorder (ADHD), combined type    Autism    Disruptive mood dysregulation disorder (HCC)    History of Aggressive behavior    No past surgical history on file.   Home Medications Prior to Admission medications   Medication Sig Start Date End Date Taking? Authorizing Provider  atomoxetine (STRATTERA) 40 MG capsule Take 40 mg by mouth in the morning.    [provider]  busPIRone (BUSPAR) 15 MG tablet Take 15 mg by mouth 3 (three) times daily. 07/31/21   [provider]  cetirizine (ZYRTEC) 10 MG tablet Take 1 tablet (10 mg total) by mouth daily. Please make appointment for further refills. 01/10/22   Shelby Mattocks, DO  chlorproMAZINE (THORAZINE) 25 MG tablet Take 3 tablets (75 mg total) by mouth at bedtime. 11/11/22   Carrion-Carrero, Karle Starch, MD  chlorproMAZINE (THORAZINE) 50 MG tablet Take 1 tablet (50 mg total) by mouth in the morning. 11/12/22   Carrion-Carrero, Karle Starch, MD  docusate sodium  (STOOL SOFTENER) 100 MG capsule Take 1 capsule (100 mg total) by mouth daily. 10/17/22   Erick Alley, DO  fluticasone (FLONASE) 50 MCG/ACT nasal spray Place 2 sprays into both nostrils daily. 09/29/22   Shelby Mattocks, DO  fluvoxaMINE (LUVOX) 100 MG tablet Take 100 mg by mouth 2 (two) times daily.    [provider]  hydrOXYzine (ATARAX) 25 MG tablet Take 25 mg by mouth 3 (three) times daily. 08/02/21   [provider]  melatonin 3 MG TABS tablet Take 1 tablet (3 mg total) by mouth at bedtime. 01/28/22   Shelby Mattocks, DO  vitamin D3 (CHOLECALCIFEROL) 25 MCG tablet Take 1 tablet (1,000 Units total) by mouth daily. 10/17/22   Erick Alley, DO      Allergies    Patient has no known allergies.    Review of Systems   Review of Systems Negative except as per HPI.  Physical Exam Updated Vital Signs BP (!) 125/90   Pulse (!) 104   Temp 98.2 F (36.8 C) (Oral)   Resp 16  Physical Exam Vitals and nursing note reviewed.  Constitutional:      Appearance: Normal appearance.  HENT:     Head: Normocephalic and atraumatic.     Mouth/Throat:     Mouth: Mucous membranes are moist.  Eyes:     General: No scleral icterus. Cardiovascular:     Rate and Rhythm: Normal rate and  regular rhythm.     Pulses: Normal pulses.     Heart sounds: Normal heart sounds.  Pulmonary:     Effort: Pulmonary effort is normal.     Breath sounds: Normal breath sounds.  Abdominal:     General: Abdomen is flat.     Palpations: Abdomen is soft.     Tenderness: There is no abdominal tenderness.  Musculoskeletal:        General: No deformity.     Comments: Tenderness to palpation to right second and third MCP joints.  Skin:    General: Skin is warm.     Findings: No rash.  Neurological:     General: No focal deficit present.     Mental Status: He is alert.  Psychiatric:        Mood and Affect: Mood normal.     Comments: Patient appears calm and cooperative during interview and examination.      ED Results / Procedures / Treatments   Labs (all labs ordered are listed, but only abnormal results are displayed) Labs Reviewed  COMPREHENSIVE METABOLIC PANEL  ETHANOL  RAPID URINE DRUG SCREEN, HOSP PERFORMED  CBC WITH DIFFERENTIAL/PLATELET  SALICYLATE LEVEL  ACETAMINOPHEN LEVEL    EKG None  Radiology No results found.  Procedures Procedures    Medications Ordered in ED Medications - No data to display  ED Course/ Medical Decision Making/ A&P                             Medical Decision Making Amount and/or Complexity of Data Reviewed Labs: ordered. Radiology: ordered.   This patient presents to the ED for suicidal ideation, this involves an extensive number of treatment options, and is a complaint that carries with a high risk of complications and morbidity.  The differential diagnosis includes SI/HI, psychosis, depression, anxiety, intoxication, withdrawal, infectious etiology.  This is not an exhaustive list.  Lab tests: I ordered and personally interpreted labs.  The pertinent results include: WBC unremarkable. Hbg unremarkable. Platelets unremarkable. Electrolytes unremarkable. BUN, creatinine unremarkable.  Acetaminophen and salicylate level normal.  Imaging studies: I ordered imaging studies. I personally reviewed, interpreted imaging and agree with the radiologist's interpretations. The results include: X-ray of the right hand showed no fracture or dislocation.  Problem list/ ED course/ Critical interventions/ Medical management: HPI: See above Vital signs within normal range and stable throughout visit. Laboratory/imaging studies significant for: See above. On physical examination, patient is afebrile and appears in no acute distress. This patient presents with suicidal ideation and right hand pain.  X-ray of the right hand showed no evidence of fracture or dislocation.  He reports that he has had anxiety and depression due to family issues and  current living situation.  He does not have a specific suicidal plan.  He denies thought of hurting other people.  Patient was calm and cooperative during the interview.  Presentation not consistent with acute organic causes to include delirium, dementia or drug induced disorders (acute ingestions or withdrawal; no evidence of toxidrome). Psychiatry was consulted and continued patient's hold. Patient was medically cleared and transferred to psychiatric care. I have reviewed the patient home medicines and have made adjustments as needed.  Cardiac monitoring/EKG: The patient was maintained on a cardiac monitor.  I personally reviewed and interpreted the cardiac monitor which showed an underlying rhythm of: sinus rhythm.  Additional history obtained: External records from outside source obtained and reviewed including: Chart review including  previous notes, labs, imaging.  Consultations obtained:  Disposition Admitted to behavioral health unit at [facility] emergency department. This chart was dictated using voice recognition software.  Despite best efforts to proofread,  errors can occur which can change the documentation meaning.          Final Clinical Impression(s) / ED Diagnoses Final diagnoses:  Suicidal ideation    Rx / DC Orders ED Discharge Orders     None         Jeanelle Malling, [place] 11/15/22 2107    Glyn Ade, MD 11/16/22 1324

## 2022-11-15 NOTE — ED Notes (Signed)
Patient was screened by security and changed into burgundy scrubs

## 2022-11-15 NOTE — ED Triage Notes (Addendum)
Ems- Patient was brought in due to suicidal thoughts. Patient was found punching objects. Has pain on his right hand middle finger Comes from a group home " [facility] " Patient stated is [facility]" BP 120/78 RT 104 Resp 18 O2 98   ,
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
## 2022-11-16 NOTE — ED Notes (Signed)
Contacted Ray Carter. She stated that pt would need someone to bring pt back to the group home due to staff having lack of transportation at this time.

## 2022-11-16 NOTE — Discharge Summary (Signed)
[facility] Psych ED Discharge  11/16/2022 10:28 AM Ray Carter  MRN:  [id number]  Principal Problem: Suicide ideation Discharge Diagnoses: Principal Problem:   Suicide ideation Active Problems:   Mild cognitive impairment  Clinical Impression:  Final diagnoses:  Suicidal ideation    ED Assessment Time Calculation: Start Time: 1730 Stop Time: 1805 Total Time in Minutes (Assessment Completion): 35    Subjective: On evaluation today, the patient is sitting up in bed, in no acute distress. He is calm and cooperative during this assessment. His appearance is appropriate for environment. His eye contact is good.  Speech is clear and coherent, normal pace and normal volume. He is alert and oriented x4 to person, place, time, and situation. Patient mood is euthymic, affect congruent with mood.  Thought process coherent and linear.  Thought content logical and within normal limits.  Memory, judgment, and insight fair. He denies auditory and visual hallucinations.  No indication that he is responding to internal stimuli during this assessment.  No delusions elicited during this assessment. He denies suicidal ideations. He denies homicidal ideations. Appetite and sleep are good. Patient resides in [place], [place] care group home.  He denies access to weapons.  He also attends a day program on weekdays.  Patient has been compliant with medications, no aggressive behaviors while observed overnight. Patient able to contract for safety.  Ray Carter resides in [place], [place] care group home.  He denies access to weapons.  He also attends a day program on weekdays. He endorses average sleep and appetite. He denies alcohol and substance use. Patient has not been assigned a legal guardian.    Patient offered support and encouragement. Group home owner prefers that police return patient home as she cannot pick them up at this time.     Patient and caregiver are educated and verbalize understanding of mental  health resources and other crisis services in the community. They are instructed to call 911 and present to the nearest emergency room should patient experience any suicidal/homicidal ideation, auditory/visual/hallucinations, or detrimental worsening of mental health condition.    Past Psychiatric History: ADS, ODD, and ADHD   Past Medical History:  Past Medical History:  Diagnosis Date   Adjustment disorder with mixed disturbance of emotions and conduct    Anxiety    Attention deficit hyperactivity disorder (ADHD), combined type    Autism    Disruptive mood dysregulation disorder (HCC)    History of Aggressive behavior    History reviewed. No pertinent surgical history. Family History:  Family History  Adopted: Yes   Family Psychiatric  History: None Social History:  Social History   Substance and Sexual Activity  Alcohol Use Never     Social History   Substance and Sexual Activity  Drug Use Never    Social History   Socioeconomic History   Marital status: Single    Spouse name: Not on file   Number of children: 0   Years of education: Not on file   Highest education level: Not on file  Occupational History   Not on file  Tobacco Use   Smoking status: Never   Smokeless tobacco: Never  Vaping Use   Vaping Use: Never used  Substance and Sexual Activity   Alcohol use: Never   Drug use: Never   Sexual activity: Not on file  Other Topics Concern   Not on file  Social History Narrative   Not on file   Social Determinants of Health   Financial Resource Strain: Not  on file  Food Insecurity: Not on file  Transportation Needs: Not on file  Physical Activity: Not on file  Stress: Not on file  Social Connections: Not on file    Tobacco Cessation:  A prescription for an FDA-approved tobacco cessation medication was offered at discharge and the patient refused  Current Medications: Current Facility-Administered Medications  Medication Dose Route Frequency  Provider Last Rate Last Admin   atomoxetine (STRATTERA) capsule 40 mg  40 mg Oral Daily Motley-Mangrum, Keiran Gaffey A, PMHNP       busPIRone (BUSPAR) tablet 15 mg  15 mg Oral TID Motley-Mangrum, Claudell Wohler A, PMHNP   15 mg at 11/16/22 0913   chlorproMAZINE (THORAZINE) tablet 50 mg  50 mg Oral QHS Motley-Mangrum, Dody Smartt A, PMHNP   50 mg at 11/15/22 2157   chlorproMAZINE (THORAZINE) tablet 75 mg  75 mg Oral BID Motley-Mangrum, Dietrich Samuelson A, PMHNP   75 mg at 11/16/22 0911   cholecalciferol (VITAMIN D3) 25 MCG (1000 UNIT) tablet 1,000 Units  1,000 Units Oral Daily Motley-Mangrum, Kayman Snuffer A, PMHNP   1,000 Units at 11/16/22 0912   docusate sodium (COLACE) capsule 100 mg  100 mg Oral Daily Motley-Mangrum, James Lafalce A, PMHNP   100 mg at 11/16/22 0912   fluticasone (FLONASE) 50 MCG/ACT nasal spray 2 spray  2 spray Each Nare Daily Motley-Mangrum, Lola Czerwonka A, PMHNP       fluvoxaMINE (LUVOX) tablet 100 mg  100 mg Oral BID Motley-Mangrum, Gordie Belvin A, PMHNP   100 mg at 11/15/22 2157   hydrOXYzine (ATARAX) tablet 25 mg  25 mg Oral TID Motley-Mangrum, Zaakirah Kistner A, PMHNP   25 mg at 11/16/22 0913   loratadine (CLARITIN) tablet 10 mg  10 mg Oral Daily Motley-Mangrum, Loveda Colaizzi A, PMHNP   10 mg at 11/16/22 0912   melatonin tablet 3 mg  3 mg Oral QHS Motley-Mangrum, Gottlieb Zuercher A, PMHNP   3 mg at 11/15/22 2158   Current Outpatient Medications  Medication Sig Dispense Refill   atomoxetine (STRATTERA) 40 MG capsule Take 40 mg by mouth in the morning.     busPIRone (BUSPAR) 15 MG tablet Take 15 mg by mouth 3 (three) times daily.     cetirizine (ZYRTEC) 10 MG tablet Take 1 tablet (10 mg total) by mouth daily. Please make appointment for further refills. 30 tablet 11   chlorproMAZINE (THORAZINE) 25 MG tablet Take 3 tablets (75 mg total) by mouth at bedtime. (Patient taking differently: Take 75 mg by mouth 2 (two) times daily.) 30 tablet 0   chlorproMAZINE (THORAZINE) 50 MG tablet Take 1 tablet (50 mg total) by mouth in the morning. (Patient taking  differently: Take 50 mg by mouth at bedtime.) 30 tablet 0   docusate sodium (STOOL SOFTENER) 100 MG capsule Take 1 capsule (100 mg total) by mouth daily. 30 capsule 11   fluticasone (FLONASE) 50 MCG/ACT nasal spray Place 2 sprays into both nostrils daily. 16 g 3   fluvoxaMINE (LUVOX) 100 MG tablet Take 100 mg by mouth 2 (two) times daily.     hydrOXYzine (ATARAX) 25 MG tablet Take 25 mg by mouth 3 (three) times daily.     melatonin 3 MG TABS tablet Take 1 tablet (3 mg total) by mouth at bedtime. 30 tablet 11   vitamin D3 (CHOLECALCIFEROL) 25 MCG tablet Take 1 tablet (1,000 Units total) by mouth daily. 30 tablet 11   PTA Medications: (Not in a hospital admission)   [place] Scale:  Flowsheet Row ED from [facility]/15/2022 in [facility] Emergency Department at  [facility] ED from 11/10/2022 in [facility] ED from 11/09/2022 in [facility] Emergency Department at [facility]  C-SSRS RISK CATEGORY High Risk No Risk Low Risk       Musculoskeletal: Strength & Muscle Tone: within normal limits Gait & Station: normal Patient leans: N/A  Psychiatric Specialty Exam: Presentation  General Appearance:  Appropriate for Environment  Eye Contact: Good  Speech: Clear and Coherent  Speech Volume: Normal  Handedness: Right   Mood and Affect  Mood: Euthymic  Affect: Appropriate   Thought Process  Thought Processes: Coherent  Descriptions of Associations:Intact  Orientation:Full (Time, Place and Person)  Thought Content:WDL  History of Schizophrenia/Schizoaffective disorder:No  Duration of Psychotic Symptoms:No data recorded Hallucinations:Hallucinations: None  Ideas of Reference:None  Suicidal Thoughts:Suicidal Thoughts: No SI Passive Intent and/or Plan: Without Intent; Without Plan; Without Means to Carry Out  Homicidal Thoughts:Homicidal Thoughts: No   Sensorium  Memory: Immediate Fair; Recent  Fair  Judgment: Good  Insight: Fair   Executive Functions  Concentration: Good  Attention Span: Good  Recall: Fair  Fund of Knowledge: Fair  Language: Good   Psychomotor Activity  Psychomotor Activity: Psychomotor Activity: Normal   Assets  Assets: Communication Skills; Housing; Social Support   Sleep  Sleep: Sleep: Good    Physical Exam: Physical Exam Vitals and nursing note reviewed. Exam conducted with a chaperone present.  Pulmonary:     Effort: Pulmonary effort is normal.  Musculoskeletal:        General: Normal range of motion.  Neurological:     Mental Status: He is alert.  Psychiatric:        Attention and Perception: Attention normal.        Mood and Affect: Mood normal.        Speech: Speech normal.        Behavior: Behavior is cooperative.        Thought Content: Thought content normal.        Cognition and Memory: Memory normal.        Judgment: Judgment normal.    Review of Systems  Constitutional: Negative.   HENT: Negative.    Respiratory: Negative.    Musculoskeletal: Negative.   Psychiatric/Behavioral: Negative.     Blood pressure 108/70, pulse 72, temperature 97.9 F (36.6 C), temperature source Oral, resp. rate 16, height 6\' 3"  (1.905 m), weight 108.9 kg, SpO2 95 %. Body mass index is 30.01 kg/m.   Demographic Factors:  Male, Adolescent or young adult, and Low socioeconomic status  Loss Factors: Financial problems/change in socioeconomic status  Historical Factors: NA  Risk Reduction Factors:   Living with another person, especially a relative, Positive social support, and Positive therapeutic relationship  Continued Clinical Symptoms:  More than one psychiatric diagnosis  Cognitive Features That Contribute To Risk:  Thought constriction (tunnel vision)    Suicide Risk:  Minimal: No identifiable suicidal ideation.  Patients presenting with no risk factors but with morbid ruminations; may be classified as  minimal risk based on the severity of the depressive symptoms   Follow-up Information     [facility] Follow up.   Specialty: Urgent Care Why: As needed, If symptoms worsen Contact information: [address] [place] [address] [phone]                 Medical Decision Making: Based on my evaluation the patient does not appear to have an emergency medical condition and can be discharged  with resources and follow up care in outpatient services for Medication Management and Individual Therapy Follow-up with established outpatient psychiatry at [facility].  Continue current medications.    Safety Plan Bar[place] Look will reach out to [facility] or other staff at [facility] , call 911 or call mobile crisis, or go to nearest emergency room if condition worsens or if suicidal thoughts become active Patients' will follow up with [facility] for outpatient psychiatric services (therapy/medication management).  The suicide prevention education provided includes the following: Suicide risk factors Suicide prevention and interventions National Suicide Hotline telephone number [facility] assessment telephone number [facility] Emergency Assistance 911 [facility] and/or Residential Mobile Crisis Unit telephone number Request made of family/significant other to:  staff at [facility] Group Home Remove weapons (e.g., guns, rifles, knives), all items previously/currently identified as safety concern.   Remove drugs/medications (over the counter, prescriptions, illicit drugs), all items previously/currently identified as a safety concern.     Disposition: Discharge  Jayna Mulnix MOTLEY-MANGRUM, PMHNP 11/16/2022, 10:28 AM

## 2022-11-16 NOTE — ED Notes (Signed)
Patient resting all night, calm and cooperative. 

## 2022-11-16 NOTE — ED Provider Notes (Signed)
Emergency Medicine Observation Re-evaluation Note  Ray Carter is a 22 y.o. male, seen on rounds today.  Pt initially presented to the ED for complaints of Suicidal (Suicidal Ideation and right hand laceration ) and Laceration Currently, the patient is awaiting placement to psychiatric hospital  Physical Exam  BP 108/70 (BP Location: Right Arm)   Pulse 72   Temp 97.9 F (36.6 C) (Oral)   Resp 16   Ht 6\' 3"  (1.905 m)   Wt 108.9 kg   SpO2 95%   BMI 30.01 kg/m  Physical Exam Awake and in no acute distress  ED Course / MDM  EKG:   I have reviewed the labs performed to date as well as medications administered while in observation.  Recent changes in the last 24 hours include none.  Plan  Current plan is for psych placement.    Bethann Berkshire, MD 11/16/22 [phone]

## 2022-11-16 NOTE — Discharge Instructions (Addendum)
Discharge recommendations:  Patient is to take medications as prescribed. Please see information for follow-up appointment with psychiatry and therapy. Please follow up with your primary care provider for all medical related needs.   Therapy: We recommend that patient participate in individual therapy to address mental health concerns.  Medications: The patient or guardian is to contact a medical professional and/or outpatient provider to address any new side effects that develop. The patient or guardian should update outpatient providers of any new medications and/or medication changes.   Atypical antipsychotics: If you are prescribed an atypical antipsychotic, it is recommended that your height, weight, BMI, blood pressure, fasting lipid panel, and fasting blood sugar be monitored by your outpatient providers.  Safety:  The patient should abstain from use of illicit substances/drugs and abuse of any medications. If symptoms worsen or do not continue to improve or if the patient becomes actively suicidal or homicidal then it is recommended that the patient return to the closest hospital emergency department, the [facility], or call 911 for further evaluation and treatment. National Suicide Prevention Lifeline 1-800-SUICIDE or 564-821-0907.  About 988 988 offers 24/7 access to trained crisis counselors who can help people experiencing mental health-related distress. People can call or text 988 or chat 988lifeline.org for themselves or if they are worried about a loved one who may need crisis support.  Crisis Mobile: [facility]:                     228-229-9938 (for crisis response 24 hours a day) [facility] Hotline:                                            617-830-4893    Safety Plan Adon Senn will reach out to [facility] or other staff at [facility] , call 911 or call mobile crisis, or go to nearest emergency room  if condition worsens or if suicidal thoughts become active Patients' will follow up with [facility] for outpatient psychiatric services (therapy/medication management).  The suicide prevention education provided includes the following: Suicide risk factors Suicide prevention and interventions National Suicide Hotline telephone number [facility] assessment telephone number [facility] Emergency Assistance 911 [facility] and/or Residential Mobile Crisis Unit telephone number Request made of family/significant other to:  staff at [facility] Group Home Remove weapons (e.g., guns, rifles, knives), all items previously/currently identified as safety concern.   Remove drugs/medications (over the counter, prescriptions, illicit drugs), all items previously/currently identified as a safety concern.

## 2022-11-26 NOTE — Progress Notes (Deleted)
    SUBJECTIVE:   Chief compliant/HPI: annual examination  Ray Carter is a 22 y.o. who presents today for an annual exam.   Reviewed and updated history ***.   Review of systems form notable for ***.    OBJECTIVE:   There were no vitals taken for this visit.  ***  ASSESSMENT/PLAN:   No problem-specific Assessment & Plan notes found for this encounter.    Annual Examination  See AVS for age appropriate recommendations  PHQ score ***, reviewed and discussed.  Blood pressure reviewed and at goal. ***   Advanced directive ***   Considered the following items based upon USPSTF recommendations: HIV testing: {not indicated/requested/declined:14582} Hepatitis C: {not indicated/requested/declined:14582} Hepatitis B: {not indicated/requested/declined:14582} Syphilis if at high risk: {{not indicated/requested/declined:14582} GC/CT{not indicated/requested/declined:14582} Lipid panel (nonfasting or fasting) discussed based upon AHA recommendations and {ordered not order:23822}.  Consider repeat every 4-6 years.  Reviewed risk factors for latent tuberculosis and {not indicated/requested/declined:14582} Immunizations ***   Follow up in 1 year or sooner if indicated.    Celine Mans, MD [facility]

## 2022-11-27 ENCOUNTER — Encounter: Payer: Medicaid Other | Admitting: Family Medicine

## 2022-12-02 ENCOUNTER — Other Ambulatory Visit: Payer: Self-pay | Admitting: Family Medicine

## 2022-12-02 ENCOUNTER — Ambulatory Visit (INDEPENDENT_AMBULATORY_CARE_PROVIDER_SITE_OTHER): Payer: Medicaid Other | Admitting: Family Medicine

## 2022-12-02 ENCOUNTER — Encounter: Payer: Self-pay | Admitting: Family Medicine

## 2022-12-02 VITALS — BP 120/60 | HR 90 | Ht 75.0 in | Wt 249.4 lb

## 2022-12-02 DIAGNOSIS — Z0000 Encounter for general adult medical examination without abnormal findings: Secondary | ICD-10-CM | POA: Diagnosis not present

## 2022-12-02 DIAGNOSIS — Z13220 Encounter for screening for lipoid disorders: Secondary | ICD-10-CM | POA: Diagnosis not present

## 2022-12-02 DIAGNOSIS — Z114 Encounter for screening for human immunodeficiency virus [HIV]: Secondary | ICD-10-CM

## 2022-12-02 DIAGNOSIS — R739 Hyperglycemia, unspecified: Secondary | ICD-10-CM | POA: Diagnosis not present

## 2022-12-02 DIAGNOSIS — Z1159 Encounter for screening for other viral diseases: Secondary | ICD-10-CM

## 2022-12-02 DIAGNOSIS — R7401 Elevation of levels of liver transaminase levels: Secondary | ICD-10-CM

## 2022-12-02 NOTE — Patient Instructions (Addendum)
It was nice seeing you today!  Return in 1 year for next physical or sooner if needed.  Try to increase fruits and vegetables to at least 5 servings per day.  Stay well, Littie Deeds, MD [facility] [phone]  --  Make sure to check out at the front desk before you leave today.  Please arrive at least 15 minutes prior to your scheduled appointments.  If you had blood work today, I will send you a MyChart message or a letter if results are normal. Otherwise, I will give you a call.  If you had a referral placed, they will call you to set up an appointment. Please give [place] a call if you don't hear back in the next 2 weeks.  If you need additional refills before your next appointment, please call your pharmacy first.

## 2022-12-02 NOTE — Progress Notes (Signed)
SUBJECTIVE:   CHIEF COMPLAINT / HPI:  Chief Complaint  Patient presents with   Annual Exam    Patient was recently seen in the ED on 5/18 for suicidal ideation, ultimately transferred to inpatient psychiatry.  Patient is here today with group home worker.  He is here for a physical exam, also needs some paperwork that needs to be filled out for his group home.  Exercises with walking 60 minutes/week.  Occasionally will go to [employer]. He enjoys fishing. No smoking, alcohol, recreational drug use.  Group home worker was asking about if there was something he could take for his appetite and his excessive intake of sweets.  Patient reports that he is stressed eating.  Apparently he will sometimes eat a whole cake in 1 sitting.  Patient reports he has some swelling in his third and fourth digits of his right hand that started a few days ago after he went bowling.  He does not have any pain.  PERTINENT  PMH / PSH: ADHD, anxiety, depression (resides in group home for the past 4 years)  Patient Care Team: Shelby Mattocks, DO as PCP - General (Family Medicine)   OBJECTIVE:   BP 120/60   Pulse 90   Ht 6\' 3"  (1.905 m)   Wt 249 lb 6 oz (113.1 kg)   SpO2 98%   BMI 31.17 kg/m   Physical Exam Constitutional:      General: He is not in acute distress.    Appearance: He is obese.  HENT:     Head: Normocephalic and atraumatic.     Right Ear: There is impacted cerumen.     Left Ear: There is impacted cerumen.     Mouth/Throat:     Mouth: Mucous membranes are moist.     Pharynx: Oropharynx is clear.  Eyes:     Extraocular Movements: Extraocular movements intact.  Cardiovascular:     Rate and Rhythm: Normal rate and regular rhythm.  Pulmonary:     Effort: Pulmonary effort is normal. No respiratory distress.     Breath sounds: Normal breath sounds.  Abdominal:     General: Bowel sounds are normal.     Palpations: Abdomen is soft.     Tenderness: There is no abdominal  tenderness.  Musculoskeletal:     Cervical back: Neck supple.  Neurological:     Mental Status: He is alert.         12/02/2022   10:22 AM  Depression screen PHQ 2/9  Decreased Interest 0  Down, Depressed, Hopeless 0  PHQ - 2 Score 0  Altered sleeping 0  Tired, decreased energy 0  Change in appetite 1  Feeling bad or failure about yourself  0  Trouble concentrating 0  Moving slowly or fidgety/restless 0  Suicidal thoughts 0  PHQ-9 Score 1  Difficult doing work/chores Somewhat difficult     {Show previous vital signs (optional):23777}    ASSESSMENT/PLAN:   1. Encounter for routine history and physical examination of adult [place] 2 form filled out and scanned into chart.  2. Screening for hyperlipidemia - Lipid Panel  3. Hyperglycemia - Hemoglobin A1c  4. Encounter for hepatitis C screening test for low risk patient - HCV Ab w Reflex to Quant PCR ([place], Age 93-79)  5. Screening for human immunodeficiency virus - HIV Antibody (routine testing w rflx) (Age 68-75)  6. Transaminitis Noted on recent lab work, very mild transaminitis probably fatty liver or could be related to his medications. -  Comprehensive metabolic panel    Return in about 1 year (around 12/02/2023) for physical.   Littie Deeds, MD [facility]

## 2022-12-03 LAB — LIPID PANEL
Chol/HDL Ratio: 5.7 ratio — ABNORMAL HIGH (ref 0.0–5.0)
Cholesterol, Total: 166 mg/dL (ref 100–199)
HDL: 29 mg/dL — ABNORMAL LOW (ref >39)
LDL Chol Calc (NIH): 104 mg/dL — ABNORMAL HIGH (ref 0–99)
Triglycerides: 190 mg/dL — ABNORMAL HIGH (ref 0–149)
VLDL Cholesterol Cal: 33 mg/dL (ref 5–40)

## 2022-12-03 LAB — COMPREHENSIVE METABOLIC PANEL
ALT: 81 IU/L — ABNORMAL HIGH (ref 0–44)
AST: 38 IU/L (ref 0–40)
Albumin/Globulin Ratio: 2.1 (ref 1.2–2.2)
Albumin: 4.6 g/dL (ref 4.3–5.2)
Alkaline Phosphatase: 77 IU/L (ref 44–121)
BUN/Creatinine Ratio: 11 (ref 9–20)
BUN: 14 mg/dL (ref 6–20)
Bilirubin Total: 0.8 mg/dL (ref 0.0–1.2)
CO2: 24 mmol/L (ref 20–29)
Calcium: 10.1 mg/dL (ref 8.7–10.2)
Chloride: 102 mmol/L (ref 96–106)
Creatinine, Ser: 1.32 mg/dL — ABNORMAL HIGH (ref 0.76–1.27)
Globulin, Total: 2.2 g/dL (ref 1.5–4.5)
Glucose: 98 mg/dL (ref 70–99)
Potassium: 4.5 mmol/L (ref 3.5–5.2)
Sodium: 140 mmol/L (ref 134–144)
Total Protein: 6.8 g/dL (ref 6.0–8.5)
eGFR: 79 mL/min/{1.73_m2} (ref >59)

## 2022-12-03 LAB — HIV ANTIBODY (ROUTINE TESTING W REFLEX): HIV Screen 4th Generation wRfx: NONREACTIVE

## 2022-12-03 LAB — HEMOGLOBIN A1C
Est. average glucose Bld gHb Est-mCnc: 111 mg/dL
Hgb A1c MFr Bld: 5.5 % (ref 4.8–5.6)

## 2022-12-03 LAB — HCV INTERPRETATION

## 2022-12-03 LAB — HCV AB W REFLEX TO QUANT PCR: HCV Ab: NONREACTIVE

## 2022-12-04 ENCOUNTER — Ambulatory Visit (HOSPITAL_COMMUNITY)
Admission: EM | Admit: 2022-12-04 | Discharge: 2022-12-04 | Disposition: A | Discharge status: Home / Self Care | Payer: Medicaid Other | Attending: Psychiatry | Admitting: Psychiatry

## 2022-12-04 DIAGNOSIS — Z765 Malingerer [conscious simulation]: Secondary | ICD-10-CM | POA: Insufficient documentation

## 2022-12-04 DIAGNOSIS — Z5901 Sheltered homelessness: Secondary | ICD-10-CM | POA: Insufficient documentation

## 2022-12-04 DIAGNOSIS — F840 Autistic disorder: Secondary | ICD-10-CM | POA: Insufficient documentation

## 2022-12-04 NOTE — ED Notes (Signed)
Per [employer] the pt has a hx of claiming to be suicidal or homicidal when he is unhappy with the group home staff. Per [employer] they are willing to take the pt back home whenever he is discharged. Pt reports " if you guys don't keep me, I am going to sue". Pt states he does not want to go back to his group home.

## 2022-12-04 NOTE — ED Notes (Signed)
Placed call to [employer] again to verify for providing transport for patient back to group home per disposition. [employer] dispatch stated they still had the call in and were still awaiting availability of an officer for transport.
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
## 2022-12-04 NOTE — ED Provider Notes (Signed)
Behavioral Health Urgent Care Medical Screening Exam  Patient Name: Ray Carter MRN: [id number] Date of Evaluation: 12/04/22 Chief Complaint:   Diagnosis:  Final diagnoses:  Malingering    History of Present illness: Ray Carter is a 22 y.o. male.   Patient presents voluntarily  refusing to communicate. He then souts "I need to be admitted there, just get me in, I am suicidal I need to be here tonight". When asked to elaborate about being suicidal, patient states "its none of your business".   Patient has poor eye contact. He refuses to talk except to ask for admission to observation unit "for tonight". He has no sign of distress.   Per chart review, patient has had multiple visits  here at [facility] and ED for the same purpose, last visit being on 11/15/22. He refuses to communicate  his situation. Patient has a hx of ODD, ADHD, ASD, DMD. Currently resides at a group home.   Provider contacted group home owner Kathryne Hitch [phone]. Kathryne Hitch reported that patient has been calling her at work saying that he wants to talk to her, that she needs to come to the group home. Natosha told him that she will come to the group home after work. Patient would not tell her what's going on. She reported that patient has been displaying this type of behaviors  and when this happens, patient chooses not to talk.  She reported that patient needs to go back to the group home and she will talk with him to figure out what is going on.   Upon assessment: 22 year old sitting on the floor in the assessment room. He is guarded with poor eye contact.  He is not cooperative and refuses to talk except to request admission to observation unit. Patient states "I need to be admitted there, I need to spend the night here".  He refuses to discuss about his mental health condition. He then states "I am suicidal, that's why I am here". When asked about having any plan or intent, patient states "its none of your business". Patient  requested to speak to another provider but did not say anything to him.   Patient does not appear to be in any acute distress and his caregiver is ready to receive him at the group home. He will be discharged with recommendations to follow up in outpatient services as needed.        Flowsheet Row ED from 12/04/2022 in [facility] ED from 11/15/2022 in [facility] Emergency Department at [facility] ED from 11/10/2022 in [facility]  C-SSRS RISK CATEGORY Moderate Risk High Risk No Risk       Psychiatric Specialty Exam  Presentation  General Appearance:Appropriate for Environment  Eye Contact:Poor  Speech:Clear and Coherent  Speech Volume:Increased  Handedness:Right   Mood and Affect  Mood: Irritable  Affect: Flat   Thought Process  Thought Processes: Coherent  Descriptions of Associations:Intact  Orientation:Full (Time, Place and Person)  Thought Content:WDL  Diagnosis of Schizophrenia or Schizoaffective disorder in past: No   Hallucinations:None  Ideas of Reference:None  Suicidal Thoughts:Yes, Passive Without Intent  Homicidal Thoughts:No Without Intent; Without Plan   Sensorium  Memory: Immediate Fair; Recent Fair; Remote Fair  Judgment: Fair  Insight: Fair   Chartered certified accountant: Fair  Attention Span: Fair  Recall: [employer] of Knowledge: Fair  Language: Fair   Psychomotor Activity  Psychomotor Activity: Normal   Assets  Assets: Manufacturing systems engineer;  Physical Health   Sleep  Sleep: Fair  Number of hours:  11   Physical Exam: Physical Exam Constitutional:      Appearance: Normal appearance.  HENT:     Head: Normocephalic and atraumatic.     Right Ear: Tympanic membrane normal.     Nose: Nose normal.  Eyes:     Pupils: Pupils are equal, round, and reactive to light.  Cardiovascular:     Rate and Rhythm: Normal rate.      Pulses: Normal pulses.  Pulmonary:     Effort: Pulmonary effort is normal.  Musculoskeletal:        General: Normal range of motion.     Cervical back: Normal range of motion.  Neurological:     General: No focal deficit present.     Mental Status: He is alert and oriented to person, place, and time.    Review of Systems  Constitutional: Negative.   HENT: Negative.    Eyes: Negative.   Respiratory: Negative.    Cardiovascular: Negative.   Gastrointestinal: Negative.   Genitourinary: Negative.   Musculoskeletal: Negative.   Skin: Negative.   Neurological: Negative.   Endo/Heme/Allergies: Negative.   Psychiatric/Behavioral: Negative.     Blood pressure 117/86, pulse 85, temperature 97.9 F (36.6 C), temperature source Oral, resp. rate 18, SpO2 99 %. There is no height or weight on file to calculate BMI.  Musculoskeletal: Strength & Muscle Tone: within normal limits Gait & Station: normal Patient leans: N/A   [facility] MSE Discharge Disposition for Follow up and Recommendations: Based on my evaluation the patient does not appear to have an emergency medical condition and can be discharged with resources and follow up care in outpatient services for Medication Management, Individual Therapy, and Group Therapy   Olin Pia, NP 12/04/2022, 6:08 PM
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
## 2022-12-04 NOTE — Discharge Instructions (Signed)

## 2022-12-05 ENCOUNTER — Other Ambulatory Visit: Payer: Self-pay

## 2022-12-05 MED ORDER — CETIRIZINE HCL 10 MG PO TABS: 10.0000 mg | ORAL_TABLET | Freq: Every day | ORAL | 11 refills | Status: DC

## 2022-12-05 MED ORDER — MELATONIN 3 MG PO TABS: 3.0000 mg | ORAL_TABLET | Freq: Every day | ORAL | 11 refills | Status: DC

## 2022-12-08 ENCOUNTER — Encounter: Payer: Self-pay | Admitting: Family Medicine

## 2023-01-27 ENCOUNTER — Other Ambulatory Visit: Payer: Self-pay | Admitting: Student

## 2023-02-24 ENCOUNTER — Other Ambulatory Visit: Payer: Self-pay | Admitting: Student

## 2023-06-05 ENCOUNTER — Other Ambulatory Visit: Payer: Self-pay | Admitting: Student

## 2023-06-15 ENCOUNTER — Encounter (HOSPITAL_COMMUNITY): Payer: Self-pay

## 2023-06-15 ENCOUNTER — Emergency Department (HOSPITAL_COMMUNITY)
Admission: EM | Admit: 2023-06-15 | Discharge: 2023-06-16 | Disposition: A | Discharge status: Home / Self Care | Payer: MEDICAID | Attending: Student | Admitting: Student

## 2023-06-15 ENCOUNTER — Other Ambulatory Visit: Payer: Self-pay

## 2023-06-15 ENCOUNTER — Emergency Department (HOSPITAL_COMMUNITY): Payer: MEDICAID

## 2023-06-15 DIAGNOSIS — R45851 Suicidal ideations: Secondary | ICD-10-CM

## 2023-06-15 DIAGNOSIS — M25512 Pain in left shoulder: Secondary | ICD-10-CM | POA: Insufficient documentation

## 2023-06-15 DIAGNOSIS — F909 Attention-deficit hyperactivity disorder, unspecified type: Secondary | ICD-10-CM | POA: Insufficient documentation

## 2023-06-15 DIAGNOSIS — F840 Autistic disorder: Secondary | ICD-10-CM | POA: Diagnosis not present

## 2023-06-15 LAB — COMPREHENSIVE METABOLIC PANEL
ALT: 69 U/L — ABNORMAL HIGH (ref 0–44)
AST: 35 U/L (ref 15–41)
Albumin: 4.4 g/dL (ref 3.5–5.0)
Alkaline Phosphatase: 60 U/L (ref 38–126)
Anion gap: 10 (ref 5–15)
BUN: 15 mg/dL (ref 6–20)
CO2: 25 mmol/L (ref 22–32)
Calcium: 9.3 mg/dL (ref 8.9–10.3)
Chloride: 103 mmol/L (ref 98–111)
Creatinine, Ser: 1.06 mg/dL (ref 0.61–1.24)
GFR, Estimated: >60 mL/min (ref >60)
Glucose, Bld: 79 mg/dL (ref 70–99)
Potassium: 3.7 mmol/L (ref 3.5–5.1)
Sodium: 138 mmol/L (ref 135–145)
Total Bilirubin: 1.1 mg/dL (ref <1.2)
Total Protein: 7.8 g/dL (ref 6.5–8.1)

## 2023-06-15 LAB — ETHANOL: Alcohol, Ethyl (B): <10 mg/dL (ref <10)

## 2023-06-15 LAB — ACETAMINOPHEN LEVEL: Acetaminophen (Tylenol), Serum: <10 ug/mL — ABNORMAL LOW (ref 10–30)

## 2023-06-15 LAB — CBC
HCT: 52.2 % — ABNORMAL HIGH (ref 39.0–52.0)
Hemoglobin: 17.8 g/dL — ABNORMAL HIGH (ref 13.0–17.0)
MCH: 27.5 pg (ref 26.0–34.0)
MCHC: 34.1 g/dL (ref 30.0–36.0)
MCV: 80.7 fL (ref 80.0–100.0)
Platelets: 260 10*3/uL (ref 150–400)
RBC: 6.47 MIL/uL — ABNORMAL HIGH (ref 4.22–5.81)
RDW: 13.9 % (ref 11.5–15.5)
WBC: 8.8 10*3/uL (ref 4.0–10.5)
nRBC: 0 % (ref 0.0–0.2)

## 2023-06-15 LAB — SALICYLATE LEVEL: Salicylate Lvl: <7 mg/dL — ABNORMAL LOW (ref 7.0–30.0)

## 2023-06-15 MED ORDER — ACETAMINOPHEN 325 MG PO TABS
650.0000 mg | ORAL_TABLET | Freq: Once | ORAL | Status: AC
  Administered 2023-06-15: 650 mg via ORAL
  Filled 2023-06-15: qty 2

## 2023-06-15 NOTE — BH Assessment (Signed)
@  1941, Patient was deferred to [facility] for a telepsych assessment. The assigned care coordinator, Junie Panning, will provide updates regarding the scheduling of the assessment. Junie Panning can be reached at [phone] for further information on the timing of the telepsych evaluation. The patient's emergency care team has been updated  and [facility] will notify them of ongoing updates. Please continue to liaise with Lauryn for additional details regarding the assessment and disposition. No further updates at this time.

## 2023-06-15 NOTE — ED Triage Notes (Signed)
BIB PD from group home for psychiatric evaluation. PD states that pt says he is having thoughts of hurting himself or his group home manager. Pt states he does not know who he wants to hurt or if he wants to hurt himself.

## 2023-06-15 NOTE — ED Notes (Signed)
Gave pt a urine cup for sample when he has to go.  Also gave pt 2 sprites, Ham sandwich and graham crackers and peanut butter.

## 2023-06-15 NOTE — ED Notes (Signed)
Pt's belongings are in locker 31. Pt has been seen and wand by security.

## 2023-06-15 NOTE — Consult Note (Signed)
Patient came in this evening and did not want to interact with provider.  He states he was brought in for Mental evaluation and then asked Provider to allow him some time before he can participate in the evaluation.
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
## 2023-06-15 NOTE — Discharge Instructions (Signed)
You are medically and psychiatrically cleared we recommend that you follow-up with a psychiatrist, outpatient.  You can follow-up with the health, or one of the psychiatrist/states above.  Return to the ER if you have concerns about hurting yourself, or others.  Your x-ray was reassuring

## 2023-06-15 NOTE — Consult Note (Signed)
[facility] Consult Note  Patient Name: Ray Carter MRN: [id number] DOB: Nov 18, 2000 DATE OF Consult: 06/15/2023  PRIMARY PSYCHIATRIC DIAGNOSES  1.  Autism Spectrum Disorder  2.  Suicidal Ideations   RECOMMENDATIONS  Recommendations: Medication recommendations:  -- Patient unable to identify current medications at this time. Would recommend pharmacy reconciliation for accuracy.  -- Once reconciled, continue current home regimen.   -- Zyprexa 10mg  PO/IM Q6H PRN for acute agitation with Benadryl 25mg  PO/IM Q6H PRN for acute agitation/ EPS  Non-Medication/therapeutic recommendations:  -- Patient to return to group home -- Patient to follow up with outpatient psychiatrist/ therapist  -- Please provide additional community and crisis resources upon discharge  Is inpatient psychiatric hospitalization recommended for this patient? -- No, patient does not meet criteria for inpatient psychiatric admission at this time.   Communication: Treatment team members (and family members if applicable) who were involved in treatment/care discussions and planning, and with whom we spoke or engaged with via secure text/chat, include the following: ED primary team  Thank you for involving [place] in the care of this patient. If you have any additional questions or concerns, please call [phone] and ask for me or the provider on-call.  TELEPSYCHIATRY ATTESTATION & CONSENT  As the provider for this telehealth consult, I attest that I verified the patient's identity using two separate identifiers, introduced myself to the patient, provided my credentials, disclosed my location, and performed this encounter via a HIPAA-compliant, real-time, face-to-face, two-way, interactive audio and video platform and with the full consent and agreement of the patient (or guardian as applicable.)  Patient physical location: ED in [facility]. Telehealth provider physical location: home office in state of [place].  Video start time: 2159 [facility] Time) Video end time: 2208 [facility] Time)  IDENTIFYING DATA  Ray Carter is a 22 y.o. year-old male for whom a psychiatric consultation has been ordered by the primary provider. The patient was identified using two separate identifiers.  CHIEF COMPLAINT/REASON FOR CONSULT  Suicidal Ideations, Homicidal Ideations  HISTORY OF PRESENT ILLNESS (HPI)  The patient is a 22yo male who presented to the emergency department, via [facility] from group home, for having thoughts of wanting to hurt himself and/or his group home manager for the last one month.  Patient initially states "I don't want to talk about it". He then states he came to the hospital because earlier in the day he was having thoughts of wanting to hurt himself. He states he didn't want to hurt himself nor kill himself but was having thoughts "I just thought I would rather be dead". Patient states he feels "stressful" at his group home. He does not specify current stressors other than "it's just personal stuff". Patient states he did not have a plan nor intent to kill himself. He denies current suicidal ideations and thoughts of death. Patient then asked about homicidal ideations he and replies "earlier". States "I was just really frustrated". Patient states he does not want to hurt anyone nor did he have a plan to hurt anyone earlier but became upset. He will not elaborate. No active homicidal ideations. He denies hallucinations, paranoia. No delusions apparent otherwise. He does not appear overtly psychotic. No symptoms indicative of mania. No drug or alcohol use. Reports sleeping and eating well.   Patient states he ultimately doesn't like his current living arrangement. States he has lived at the group home for the last 4 years. Wishes to have more freedom. When asked if he would be safe  returning to the group home he replies "yeah but I don't want to".       PAST PSYCHIATRIC HISTORY  Past  Hospitalizations- Yes, does not remember when last admission was  Outpatient psychiatrist/ therapist Reports compliance with medications however unable to specify names, dosages.   Per chart review, patient last seen in the ED 12/04/2022 for reports of suicidal ideations however when assessed, patient refused to communicate appropriately stating it was "none of your business". Per documentation, multiple ED visits and visits at [facility] for same complaints: 11/09/2022, 11/10/2022, 11/14/2022, and 11/15/2022. History of ODD, ADHD, ASD, and DMDD.   Otherwise as per HPI above.  PAST MEDICAL HISTORY  Past Medical History:  Diagnosis Date   Adjustment disorder with mixed disturbance of emotions and conduct    Anxiety    Attention deficit hyperactivity disorder (ADHD), combined type    Autism    Disruptive mood dysregulation disorder (HCC)    History of Aggressive behavior      HOME MEDICATIONS  Facility Ordered Medications  Medication   [COMPLETED] acetaminophen (TYLENOL) tablet 650 mg   PTA Medications  Medication Sig   fluvoxaMINE (LUVOX) 100 MG tablet Take 100 mg by mouth 2 (two) times daily.   atomoxetine (STRATTERA) 40 MG capsule Take 40 mg by mouth in the morning.   busPIRone (BUSPAR) 15 MG tablet Take 15 mg by mouth 3 (three) times daily.   hydrOXYzine (ATARAX) 25 MG tablet Take 25 mg by mouth 4 (four) times daily.   vitamin D3 (CHOLECALCIFEROL) 25 MCG tablet Take 1 tablet (1,000 Units total) by mouth daily.   docusate sodium (STOOL SOFTENER) 100 MG capsule Take 1 capsule (100 mg total) by mouth daily.   chlorproMAZINE (THORAZINE) 50 MG tablet Take 1 tablet (50 mg total) by mouth in the morning. (Patient taking differently: Take 50 mg by mouth at bedtime.)   chlorproMAZINE (THORAZINE) 25 MG tablet Take 3 tablets (75 mg total) by mouth at bedtime. (Patient taking differently: Take 75 mg by mouth 2 (two) times daily.)   atomoxetine (STRATTERA) 60 MG capsule Take 60 mg by mouth daily.    cetirizine (ZYRTEC) 10 MG tablet Take 1 tablet (10 mg total) by mouth daily. Please make appointment for further refills.   melatonin 3 MG TABS tablet Take 1 tablet (3 mg total) by mouth at bedtime.   fluticasone (FLONASE) 50 MCG/ACT nasal spray USE 2 SPRAYS IN EACH NOSTRIL ONCE DAILY     ALLERGIES  No Known Allergies  SOCIAL & SUBSTANCE USE HISTORY  Social History   Socioeconomic History   Marital status: Single    Spouse name: Not on file   Number of children: 0   Years of education: Not on file   Highest education level: Not on file  Occupational History   Not on file  Tobacco Use   Smoking status: Never   Smokeless tobacco: Never  Vaping Use   Vaping status: Never Used  Substance and Sexual Activity   Alcohol use: Never   Drug use: Never   Sexual activity: Not on file  Other Topics Concern   Not on file  Social History Narrative   Not on file   Social Drivers of Health   Financial Resource Strain: Not on file  Food Insecurity: Not on file  Transportation Needs: Not on file  Physical Activity: Not on file  Stress: Not on file  Social Connections: Not on file   Social History   Tobacco Use  Smoking Status Never  Smokeless Tobacco Never   Social History   Substance and Sexual Activity  Alcohol Use Never   Social History   Substance and Sexual Activity  Drug Use Never    Additional pertinent information: lives at group home    FAMILY HISTORY  Family History  Adopted: Yes   Family Psychiatric History (if known):  None disclosed at this time   MENTAL STATUS EXAM (MSE)  Mental Status Exam: General Appearance: Well Groomed and Hospital Scrubs  Orientation:  Full (Time, Place, and Person)  Memory:  Recent;   Fair  Concentration:  Concentration: Fair and Attention Span: Fair  Recall:  Fair  Attention  Fair  Eye Contact:   Fleeting  Speech:  Clear and Coherent  Language:  Fair  Volume:  Normal  Mood: "fine"  Affect:  Appropriate  Thought  Process:  Coherent, concrete   Thought Content:  Logical  Suicidal Thoughts:  No  Homicidal Thoughts:  No  Judgement:  Poor  Insight:  Lacking  Psychomotor Activity:  Normal  Akathisia:  Negative  Fund of Knowledge:  Fair    Assets:  Games developer Social Support  Cognition:  WNL  ADL's:  Intact  AIMS (if indicated):       VITALS  Blood pressure 123/84, pulse 96, temperature 98.1 F (36.7 C), temperature source Oral, resp. rate 18, height 6\' 3"  (1.905 m), weight 113.1 kg, SpO2 96%.  LABS  Admission on 06/15/2023  Component Date Value Ref Range Status   Sodium 06/15/2023 138  135 - 145 mmol/L Final   Potassium 06/15/2023 3.7  3.5 - 5.1 mmol/L Final   Chloride 06/15/2023 103  98 - 111 mmol/L Final   CO2 06/15/2023 25  22 - 32 mmol/L Final   Glucose, Bld 06/15/2023 79  70 - 99 mg/dL Final   Glucose reference range applies only to samples taken after fasting for at least 8 hours.   BUN 06/15/2023 15  6 - 20 mg/dL Final   Creatinine, Ser 06/15/2023 1.06  0.61 - 1.24 mg/dL Final   Calcium 44/06/270 9.3  8.9 - 10.3 mg/dL Final   Total Protein 53/66/4403 7.8  6.5 - 8.1 g/dL Final   Albumin 47/42/5956 4.4  3.5 - 5.0 g/dL Final   AST 38/75/6433 35  15 - 41 U/L Final   ALT 06/15/2023 69 (H)  0 - 44 U/L Final   Alkaline Phosphatase 06/15/2023 60  38 - 126 U/L Final   Total Bilirubin 06/15/2023 1.1  <1.2 mg/dL Final   GFR, Estimated 06/15/2023 >60  >60 mL/min Final   Comment: (NOTE) Calculated using the CKD-EPI Creatinine Equation (2021)    Anion gap 06/15/2023 10  5 - 15 Final   Performed at [facility], [address], [place], [place] [address]   Alcohol, Ethyl (B) 06/15/2023 <10  <10 mg/dL Final   Comment: (NOTE) Lowest detectable limit for serum alcohol is 10 mg/dL.  For medical purposes only. Performed at [facility], [address] [place], [place] [address]    Salicylate Lvl 06/15/2023 <7.0 (L)   7.0 - 30.0 mg/dL Final   Performed at [facility], [address] [place], [place] [address]   Acetaminophen (Tylenol), Serum 06/15/2023 <10 (L)  10 - 30 ug/mL Final   Comment: (NOTE) Therapeutic concentrations vary significantly. A range of 10-30 ug/mL  may be an effective concentration for many patients. However, some  are best treated at concentrations outside of this  range. Acetaminophen concentrations >150 ug/mL at 4 hours after ingestion  and >50 ug/mL at 12 hours after ingestion are often associated with  toxic reactions.  Performed at [facility], [address], [place], [place] [address]    WBC 06/15/2023 8.8  4.0 - 10.5 K/uL Final   RBC 06/15/2023 6.47 (H)  4.22 - 5.81 MIL/uL Final   Hemoglobin 06/15/2023 17.8 (H)  13.0 - 17.0 g/dL Final   HCT 52/84/1324 52.2 (H)  39.0 - 52.0 % Final   MCV 06/15/2023 80.7  80.0 - 100.0 fL Final   MCH 06/15/2023 27.5  26.0 - 34.0 pg Final   MCHC 06/15/2023 34.1  30.0 - 36.0 g/dL Final   RDW 40/03/2724 13.9  11.5 - 15.5 % Final   Platelets 06/15/2023 260  150 - 400 K/uL Final   nRBC 06/15/2023 0.0  0.0 - 0.2 % Final   Performed at [facility], [address], [place], [place] [address]    PSYCHIATRIC REVIEW OF SYSTEMS (ROS)  ROS: Notable for the following relevant positive findings: Review of Systems  Psychiatric/Behavioral:  Positive for depression. Negative for hallucinations, substance abuse and suicidal ideas. The patient is nervous/anxious. The patient does not have insomnia.     Additional findings:      Musculoskeletal: No abnormal movements observed      Gait & Station: Laying/Sitting      Pain Screening: Denies      Nutrition & Dental Concerns: No concerns   RISK FORMULATION/ASSESSMENT  Is the patient experiencing any suicidal or homicidal ideations: No       Explain if yes: passive SI/HI without plan/ without intent  Protective factors considered for safety  management: access to care, housing, social support, no access to weapons  Risk factors/concerns considered for safety management: age Depression Impulsivity Aggression Male gender Unmarried  Is there a Astronomer plan with the patient and treatment team to minimize risk factors and promote protective factors: Yes           Explain: Patient currently in the ED, medication management, comfort measure, plan to return to group home upon discharge, follow-up with outpatient psychiatric treatment team.  Is crisis care placement or psychiatric hospitalization recommended: No     Based on my current evaluation and risk assessment, patient is determined at this time to be at:  Moderate Risk  *RISK ASSESSMENT Risk assessment is a dynamic process; it is possible that this patient's condition, and risk level, may change. This should be re-evaluated and managed over time as appropriate. Please re-consult psychiatric consult services if additional assistance is needed in terms of risk assessment and management. If your team decides to discharge this patient, please advise the patient how to best access emergency psychiatric services, or to call 911, if their condition worsens or they feel unsafe in any way.   Assunta Gambles, NP [facility] Consult Services

## 2023-06-16 NOTE — ED Notes (Signed)
Group home number [phone]

## 2023-06-16 NOTE — Progress Notes (Signed)
Per Psych provider Assunta Gambles, NP-Telepsychiatry Consult Services pt has been psych cleared.  CSW added resources to pt's AVS. This CSW will now remove pt from the TTS/BH shift report. TOC to assist with discharge needs.  Care team notified: Junior Rimando,RN   Maryjean Ka, MSW, [facility] 06/16/2023 12:35 AM

## 2023-06-16 NOTE — ED Notes (Addendum)
Broke the glass to locate name and phone number for Group home and person in charge to assist with pts discharge. Kathryne Hitch [phone]. At present time Elmarie Shiley RN is trying to contact her to obtain more information.

## 2023-06-16 NOTE — ED Notes (Signed)
Group Home verified is patient's listed address. [address] [place]. Guardian is [name]

## 2023-06-16 NOTE — ED Notes (Signed)
Patient able to provide Group home number. RN attempted to call Group home but no response. Will try again later.

## 2023-06-16 NOTE — ED Notes (Signed)
TTS done and recommended patient to be DC. RN unable to locate any phone number or name of the group home he was in.

## 2023-06-16 NOTE — ED Notes (Signed)
Escorted to waiting room to meet ride to group home. Patient's demeanor suddenly changed. He began refusing to leave and threatening to "punch the wall", "punch his ride", and stating "I want to stay here". Police officer to lobby to assist patient off of the premises.

## 2023-06-26 ENCOUNTER — Other Ambulatory Visit: Payer: Self-pay

## 2023-06-26 MED ORDER — FLUTICASONE PROPIONATE 50 MCG/ACT NA SUSP: 2.0000 | Freq: Every day | NASAL | 3 refills | Status: DC

## 2023-06-26 MED ORDER — CETIRIZINE HCL 10 MG PO TABS: 10.0000 mg | ORAL_TABLET | Freq: Every day | ORAL | 11 refills | Status: DC

## 2023-06-26 NOTE — Telephone Encounter (Signed)
Received call from [facility] regarding issues with prescriptions prescribed by Dr. Royal Piedra.   Pharmacist reports that he is not enrolled in [place].   Will forward to precepting provider. Pended medications to this encounter.   Veronda Prude, RN

## 2023-07-09 ENCOUNTER — Ambulatory Visit
Admission: EM | Admit: 2023-07-09 | Discharge: 2023-07-09 | Disposition: A | Discharge status: Home / Self Care | Payer: MEDICAID

## 2023-07-09 ENCOUNTER — Encounter: Payer: Self-pay | Admitting: Emergency Medicine

## 2023-07-09 DIAGNOSIS — T161XXA Foreign body in right ear, initial encounter: Secondary | ICD-10-CM | POA: Diagnosis not present

## 2023-07-09 NOTE — ED Triage Notes (Addendum)
 Pt reports getting the cushioned end of his ear bud stuck in his R ear. Tried to get it out on shoved it further into ear canal. Reports sharp pain that gets worse when he talks. Mild hearing impairment from object.   Pt resides in a group home, staff from home will be joining at some point during the visit. They are on the way.

## 2023-07-09 NOTE — ED Provider Notes (Signed)
 [facility]    CSN: [id number] Arrival date & time: 07/09/23  1227      History   Chief Complaint Chief Complaint  Patient presents with   Foreign Body in Ear    HPI Ray Carter is a 23 y.o. male.   Patient presenting today with new onset right ear pain, muffled hearing after getting the rubber part of his earbud stuck in his right ear.  Denies bleeding, drainage, complete loss of hearing, headache.  States he stubbed his finger in there to try and get it out and pushed it even further.    Past Medical History:  Diagnosis Date   Adjustment disorder with mixed disturbance of emotions and conduct    Anxiety    Attention deficit hyperactivity disorder (ADHD), combined type    Autism    Disruptive mood dysregulation disorder (HCC)    History of Aggressive behavior     Patient Active Problem List   Diagnosis Date Noted   Autism spectrum disorder 06/15/2023   Suicidal ideation 11/15/2022   Encounter for annual general medical examination without abnormal findings in adult 10/29/2021   Chest pain of uncertain etiology 10/29/2021   History of Aggressive behavior    Attention deficit hyperactivity disorder (ADHD), combined type    Adjustment disorder with mixed disturbance of emotions and conduct    Overweight 07/15/2020   Polycythemia 07/15/2020   Mild cognitive impairment 07/13/2020   Psychiatric disorder 07/13/2020   Hypersomnolence 12/13/2019    History reviewed. No pertinent surgical history.     Home Medications    Prior to Admission medications   Medication Sig Start Date End Date Taking? Authorizing Provider  atomoxetine  (STRATTERA ) 40 MG capsule Take 40 mg by mouth in the morning.   Yes [provider]  busPIRone  (BUSPAR ) 15 MG tablet Take 15 mg by mouth 3 (three) times daily. 07/31/21  Yes [provider]  chlorproMAZINE  (THORAZINE ) 25 MG tablet Take 3 tablets (75 mg total) by mouth at bedtime. Patient taking differently: Take  75 mg by mouth 2 (two) times daily. 11/11/22  Yes Carrion-Carrero, Marlo, MD  chlorproMAZINE  (THORAZINE ) 50 MG tablet Take 1 tablet (50 mg total) by mouth in the morning. Patient taking differently: Take 50 mg by mouth at bedtime. 11/12/22  Yes Carrion-Carrero, Margely, MD  fluvoxaMINE  (LUVOX ) 100 MG tablet Take 100 mg by mouth 2 (two) times daily.   Yes [provider]  hydrOXYzine  (ATARAX ) 50 MG tablet Take 50 mg by mouth as directed. 06/04/23  Yes [provider]  melatonin 3 MG TABS tablet Take 1 tablet (3 mg total) by mouth at bedtime. 12/05/22  Yes Orlando Pond, DO  vitamin D3 (CHOLECALCIFEROL ) 25 MCG tablet Take 1 tablet (1,000 Units total) by mouth daily. 10/17/22  Yes Joshua Domino, DO  atomoxetine  (STRATTERA ) 60 MG capsule Take 60 mg by mouth daily. Patient not taking: Reported on 07/09/2023    [provider]  cetirizine  (ZYRTEC ) 10 MG tablet Take 1 tablet (10 mg total) by mouth daily. Please make appointment for further refills. 06/26/23   Delores Suzann HERO, MD  docusate sodium  (STOOL SOFTENER) 100 MG capsule Take 1 capsule (100 mg total) by mouth daily. 10/17/22   Joshua Domino, DO  fluticasone  (FLONASE ) 50 MCG/ACT nasal spray Place 2 sprays into both nostrils daily. 06/26/23   Delores Suzann HERO, MD  hydrOXYzine  (ATARAX ) 25 MG tablet Take 25 mg by mouth 4 (four) times daily. Patient not taking: Reported on 07/09/2023 08/02/21   [provider]    Family History Family History  Adopted: Yes    Social History Social History   Tobacco Use   Smoking status: Never   Smokeless tobacco: Never  Vaping Use   Vaping status: Never Used  Substance Use Topics   Alcohol use: Never   Drug use: Never     Allergies   Patient has no known allergies.   Review of Systems Review of Systems Per HPI  Physical Exam Triage Vital Signs ED Triage Vitals  Encounter Vitals Group     BP 07/09/23 1238 112/78     Systolic BP Percentile --      Diastolic BP  Percentile --      Pulse Rate 07/09/23 1238 77     Resp 07/09/23 1238 18     Temp 07/09/23 1238 98.6 F (37 C)     Temp Source 07/09/23 1238 Oral     SpO2 07/09/23 1238 95 %     Weight --      Height --      Head Circumference --      Peak Flow --      Pain Score 07/09/23 1240 5     Pain Loc --      Pain Education --      Exclude from Growth Chart --    No data found.  Updated Vital Signs BP 112/78 (BP Location: Left Arm)   Pulse 77   Temp 98.6 F (37 C) (Oral)   Resp 18   SpO2 95%   Visual Acuity Right Eye Distance:   Left Eye Distance:   Bilateral Distance:    Right Eye Near:   Left Eye Near:    Bilateral Near:     Physical Exam Vitals and nursing note reviewed.  Constitutional:      Appearance: Normal appearance.  HENT:     Head: Atraumatic.     Ears:     Comments: Foreign body visualized in right EAC.  TM intact and benign appearing    Nose: Nose normal.     Mouth/Throat:     Mouth: Mucous membranes are moist.  Eyes:     Extraocular Movements: Extraocular movements intact.     Conjunctiva/sclera: Conjunctivae normal.  Cardiovascular:     Rate and Rhythm: Normal rate and regular rhythm.  Pulmonary:     Effort: Pulmonary effort is normal.     Breath sounds: Normal breath sounds.  Musculoskeletal:        General: Normal range of motion.     Cervical back: Normal range of motion and neck supple.  Skin:    General: Skin is warm and dry.  Neurological:     General: No focal deficit present.     Mental Status: He is oriented to person, place, and time.  Psychiatric:        Mood and Affect: Mood normal.        Thought Content: Thought content normal.        Judgment: Judgment normal.      UC Treatments / Results  Labs (all labs ordered are listed, but only abnormal results are displayed) Labs Reviewed - No data to display  EKG   Radiology No results found.  Procedures Procedures (including critical care time)  Medications Ordered in  UC Medications - No data to display  Initial Impression / Assessment and Plan / UC Course  I have reviewed the triage vital signs and the nursing notes.  Pertinent labs &  imaging results that were available during my care of the patient were reviewed by me and considered in my medical decision making (see chart for details).     Foreign body removed with forceps without complication from the right EAC.  TM visualized and benign post procedure and patient has symptomatic relief.  Discussed avoiding inserting anything else into the ear including high-pressure water  for the next week or so in case of any irritation.  Return for worsening symptoms.  Final Clinical Impressions(s) / UC Diagnoses   Final diagnoses:  Foreign body of right ear, initial encounter   Discharge Instructions   None    ED Prescriptions   None    PDMP not reviewed this encounter.   Stuart Vernell Norris, [place] 07/09/23 1303

## 2023-08-23 IMAGING — CR DG HAND COMPLETE 3+V*R*
3 series · 3 of 3 positions shown · non-contrast
Comparison: None Available.

CLINICAL DATA: Status post trauma.

EXAM:
RIGHT HAND - COMPLETE 3+ VIEW

[x hand pa right]
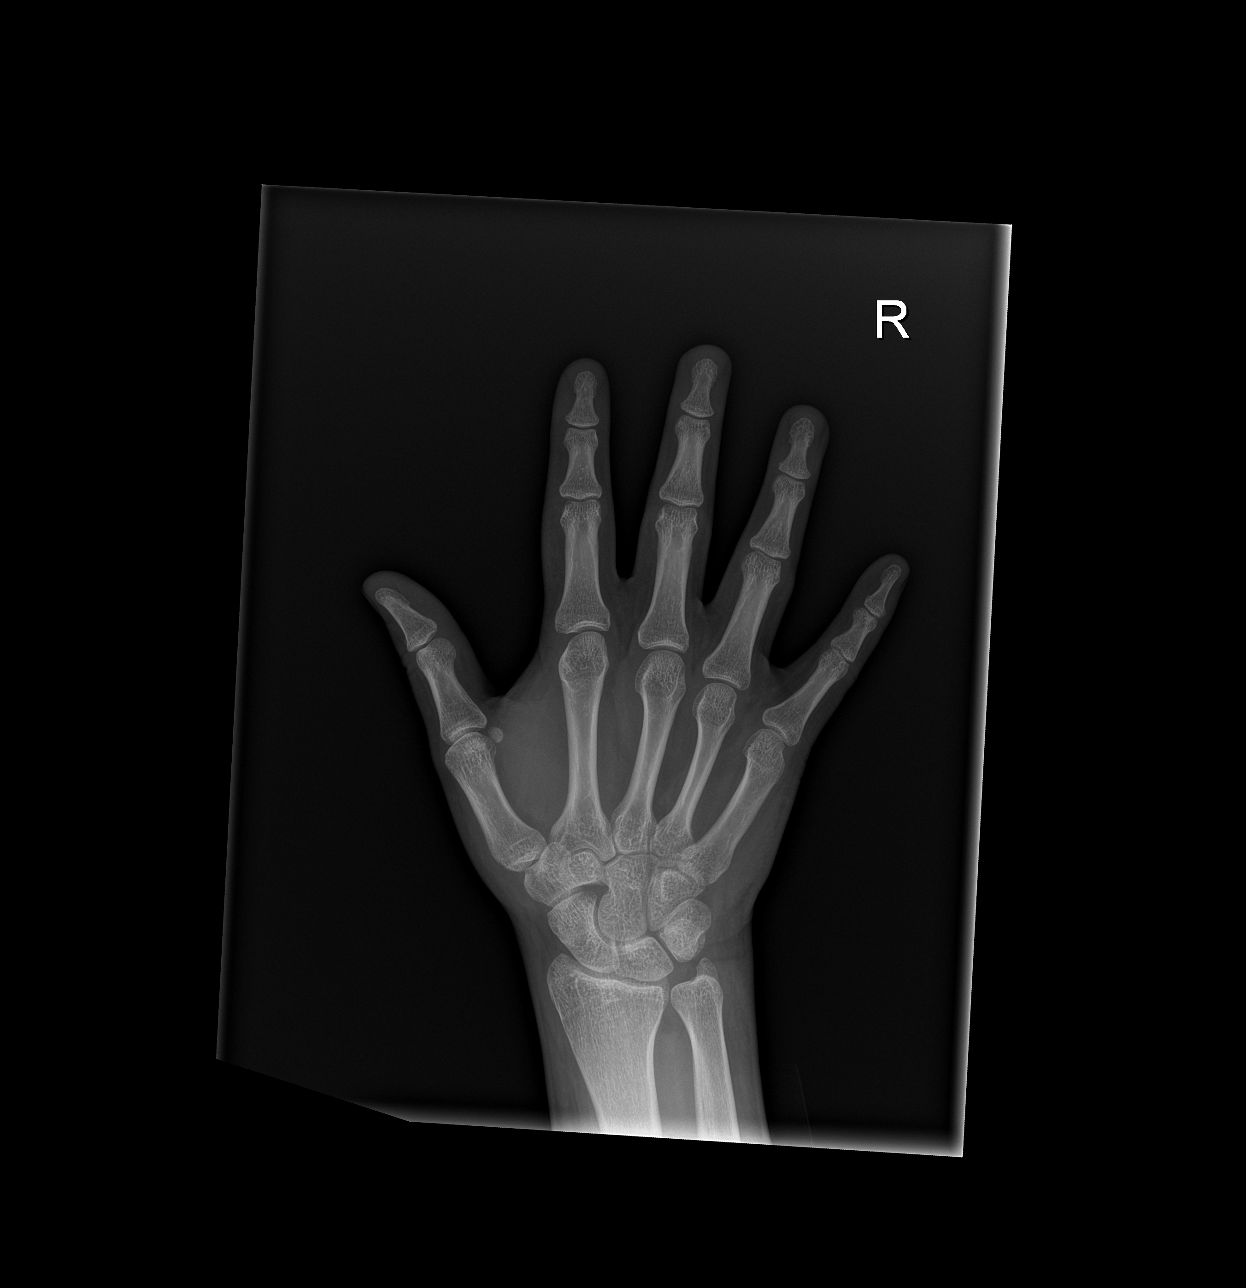

[x hand obl right]
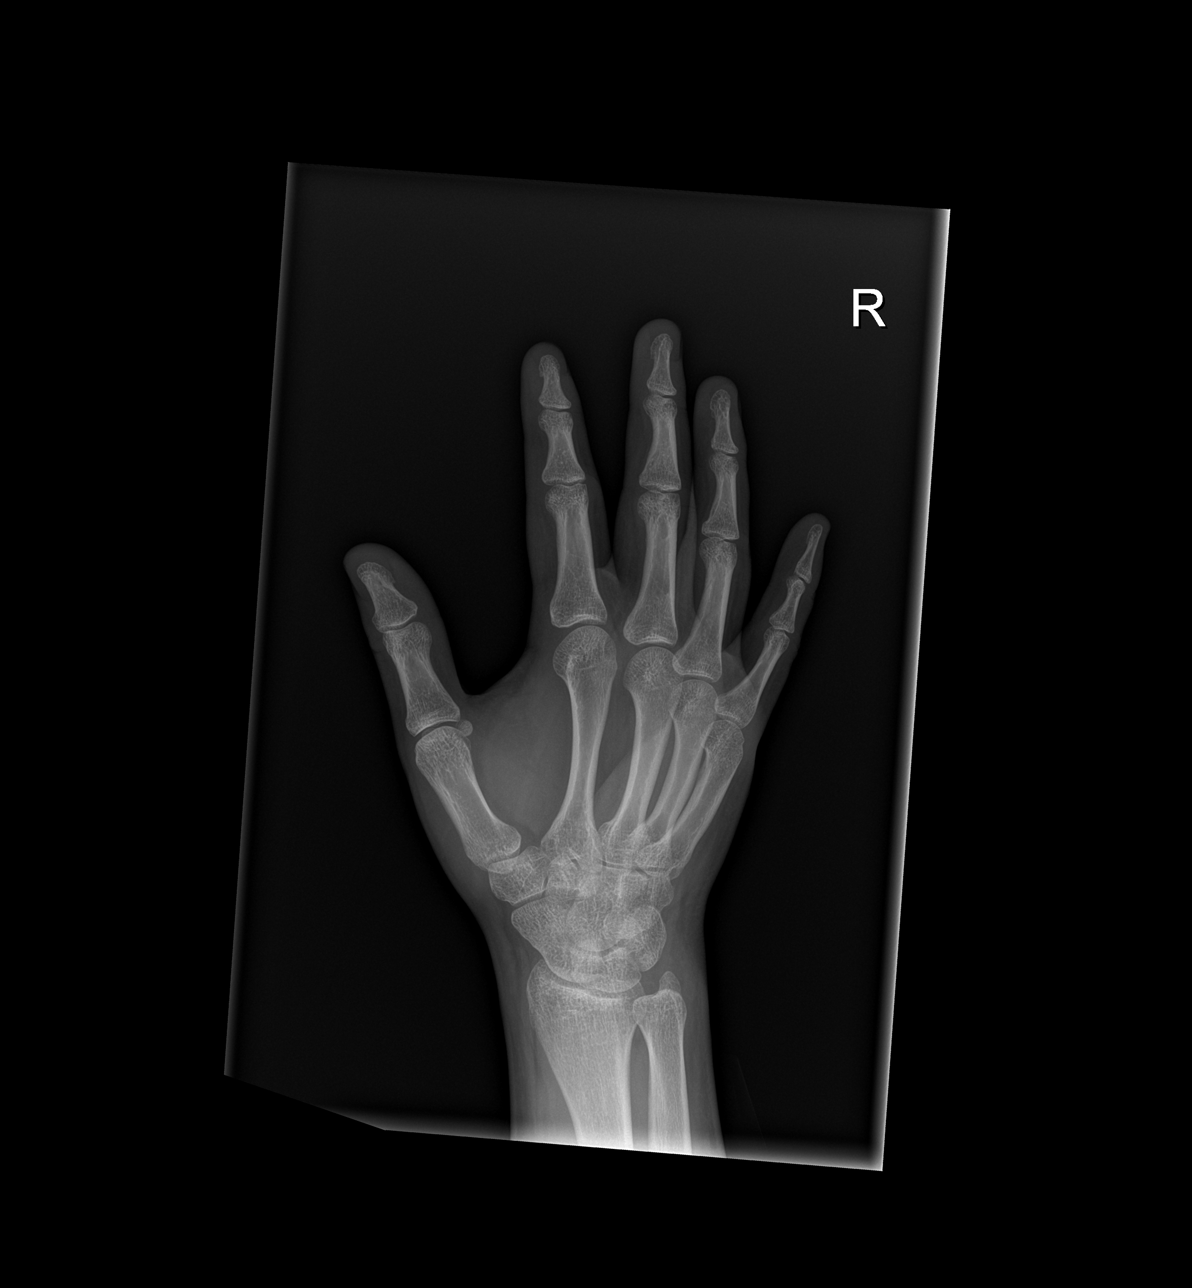

[x hand lat right]
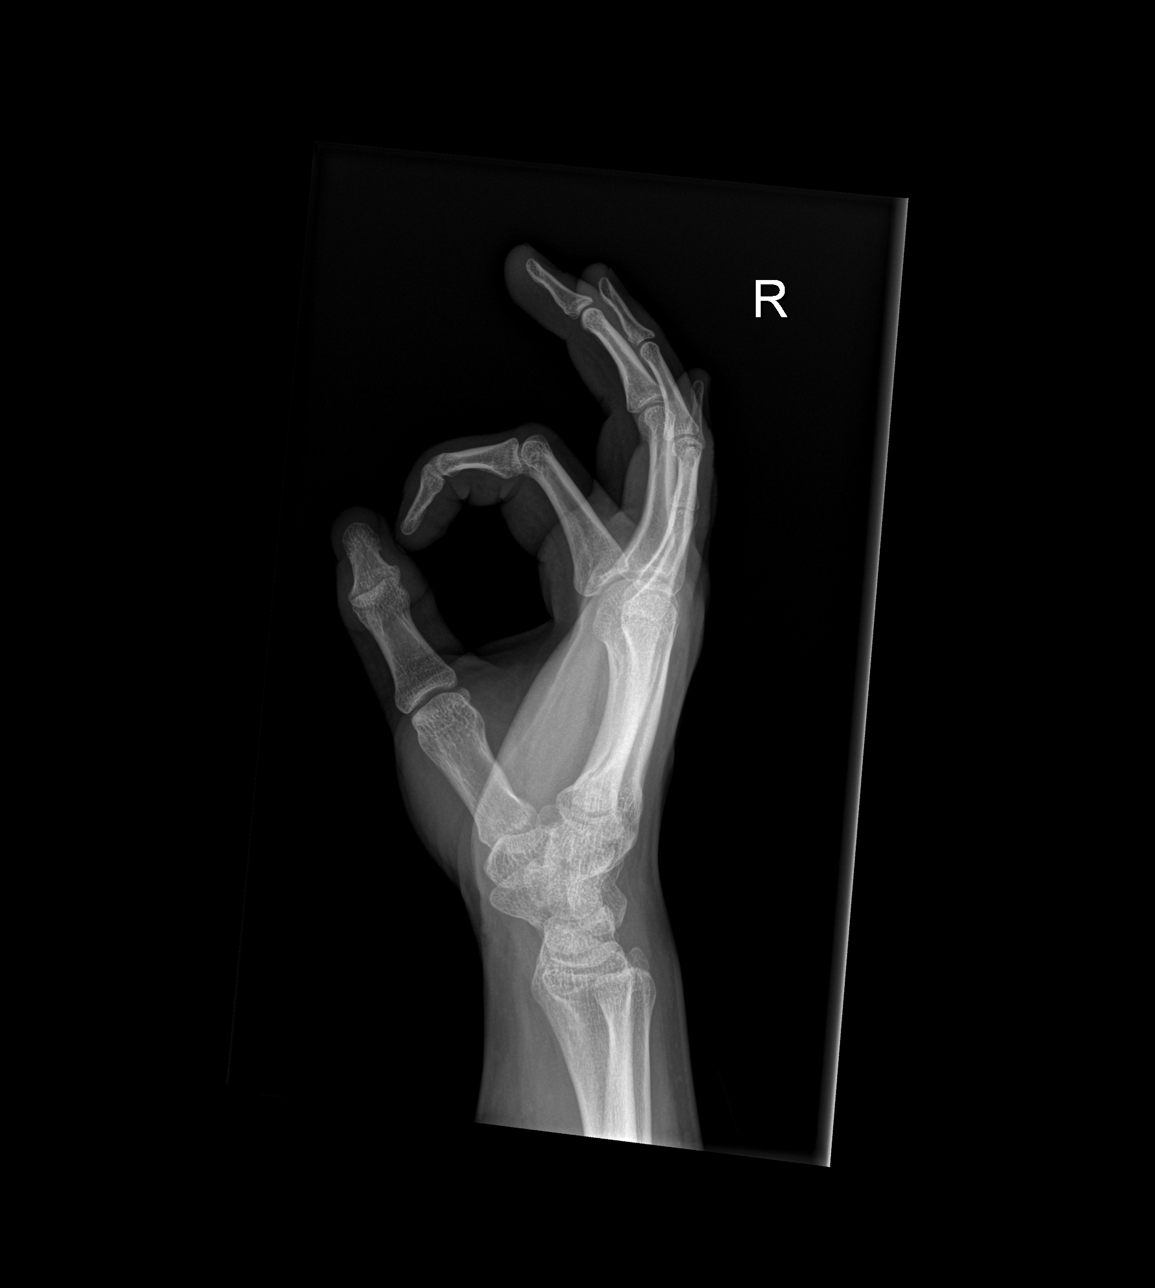

[3 of 3 positions shown; findings below may reference images not displayed]

FINDINGS: There is no evidence of fracture or dislocation. There is no
evidence of arthropathy or other focal bone abnormality. Soft
tissues are unremarkable.
IMPRESSION: Negative.

## 2023-08-26 ENCOUNTER — Other Ambulatory Visit: Payer: Self-pay | Admitting: Student

## 2023-09-17 ENCOUNTER — Other Ambulatory Visit: Payer: Self-pay

## 2023-09-18 MED ORDER — FLUTICASONE PROPIONATE 50 MCG/ACT NA SUSP: 2.0000 | Freq: Every day | NASAL | 3 refills | Status: DC

## 2023-10-09 ENCOUNTER — Telehealth: Payer: Self-pay | Admitting: Student

## 2023-10-09 NOTE — Telephone Encounter (Signed)
 Patient's caregiver dropped off standing orders to be signed. Last DOS was 12/02/22. Placed in [employer],

## 2023-10-12 NOTE — Telephone Encounter (Signed)
 Reviewed form and placed in PCP's box for completion.  Glennie Hawk, CMA

## 2023-10-19 NOTE — Telephone Encounter (Signed)
 Patient's caregiver called, LVM and informed that forms are ready for pick up. Copy made and placed in batch scanning. Original placed at front desk for pick up.   Elsie Halo, RN

## 2023-11-15 ENCOUNTER — Emergency Department (HOSPITAL_COMMUNITY)
Admission: EM | Admit: 2023-11-15 | Discharge: 2023-11-16 | Disposition: A | Discharge status: Home / Self Care | Payer: MEDICAID | Attending: Emergency Medicine | Admitting: Emergency Medicine

## 2023-11-15 ENCOUNTER — Encounter (HOSPITAL_COMMUNITY): Payer: Self-pay | Admitting: Emergency Medicine

## 2023-11-15 DIAGNOSIS — F4325 Adjustment disorder with mixed disturbance of emotions and conduct: Secondary | ICD-10-CM | POA: Diagnosis present

## 2023-11-15 DIAGNOSIS — R45851 Suicidal ideations: Secondary | ICD-10-CM | POA: Diagnosis not present

## 2023-11-15 DIAGNOSIS — F840 Autistic disorder: Secondary | ICD-10-CM | POA: Insufficient documentation

## 2023-11-15 LAB — CBC
HCT: 57 % — ABNORMAL HIGH (ref 39.0–52.0)
Hemoglobin: 18.5 g/dL — ABNORMAL HIGH (ref 13.0–17.0)
MCH: 25.7 pg — ABNORMAL LOW (ref 26.0–34.0)
MCHC: 32.5 g/dL (ref 30.0–36.0)
MCV: 79.2 fL — ABNORMAL LOW (ref 80.0–100.0)
Platelets: 306 10*3/uL (ref 150–400)
RBC: 7.2 MIL/uL — ABNORMAL HIGH (ref 4.22–5.81)
RDW: 15.2 % (ref 11.5–15.5)
WBC: 7.9 10*3/uL (ref 4.0–10.5)
nRBC: 0 % (ref 0.0–0.2)

## 2023-11-15 LAB — COMPREHENSIVE METABOLIC PANEL WITH GFR
ALT: 84 U/L — ABNORMAL HIGH (ref 0–44)
AST: 40 U/L (ref 15–41)
Albumin: 4.3 g/dL (ref 3.5–5.0)
Alkaline Phosphatase: 69 U/L (ref 38–126)
Anion gap: 12 (ref 5–15)
BUN: 11 mg/dL (ref 6–20)
CO2: 21 mmol/L — ABNORMAL LOW (ref 22–32)
Calcium: 9.6 mg/dL (ref 8.9–10.3)
Chloride: 104 mmol/L (ref 98–111)
Creatinine, Ser: 0.93 mg/dL (ref 0.61–1.24)
GFR, Estimated: >60 mL/min (ref >60)
Glucose, Bld: 108 mg/dL — ABNORMAL HIGH (ref 70–99)
Potassium: 3.9 mmol/L (ref 3.5–5.1)
Sodium: 137 mmol/L (ref 135–145)
Total Bilirubin: 1.3 mg/dL — ABNORMAL HIGH (ref 0.0–1.2)
Total Protein: 7.8 g/dL (ref 6.5–8.1)

## 2023-11-15 LAB — ETHANOL: Alcohol, Ethyl (B): <15 mg/dL (ref <15)

## 2023-11-15 MED ORDER — CHLORPROMAZINE HCL 25 MG PO TABS: 75.0000 mg | ORAL_TABLET | Freq: Two times a day (BID) | ORAL | Status: DC

## 2023-11-15 MED ORDER — CHLORPROMAZINE HCL 25 MG PO TABS
75.0000 mg | ORAL_TABLET | Freq: Every day | ORAL | Status: DC
  Administered 2023-11-15: 75 mg via ORAL
  Filled 2023-11-15: qty 3

## 2023-11-15 MED ORDER — CHLORPROMAZINE HCL 25 MG PO TABS
50.0000 mg | ORAL_TABLET | Freq: Every day | ORAL | Status: DC
  Administered 2023-11-16: 50 mg via ORAL
  Filled 2023-11-15: qty 2

## 2023-11-15 MED ORDER — ACETAMINOPHEN 325 MG PO TABS: 650.0000 mg | ORAL_TABLET | ORAL | Status: DC | PRN

## 2023-11-15 MED ORDER — BUSPIRONE HCL 10 MG PO TABS
15.0000 mg | ORAL_TABLET | Freq: Three times a day (TID) | ORAL | Status: DC
Start: 2023-11-15 — End: 2023-11-16
  Administered 2023-11-15 – 2023-11-16 (×2): 15 mg via ORAL
  Filled 2023-11-15 (×2): qty 2

## 2023-11-15 NOTE — ED Notes (Signed)
 Gave patient a [place] sandwich, 3 packets of graham crackers, 3 packets of saltine crackers, and 3 things of peanut butter with a cup of ice water .

## 2023-11-15 NOTE — ED Provider Notes (Signed)
 [place] EMERGENCY DEPARTMENT AT [facility] Provider Note   CSN: [id number] Arrival date & time: 11/15/23  1833     History  Chief Complaint  Patient presents with   Suicidal    Ray Carter is a 23 y.o. male.  HPI Patient presents from his group home with staff concern for suicidal ideation.  The patient himself is awake, alert, providing known history of denies physical pain.  He states that he sometimes has suicidal thoughts with things about his discord with his family's inability to see them.  He also acknowledges that when he is disagreements with the staff at the facility his symptoms expresses homicidal thoughts.  He states that since he is not there currently he has neither suicidal nor homicidal thoughts.  He states that he would like to go to behavioral health.    Home Medications Prior to Admission medications   Medication Sig Start Date End Date Taking? Authorizing Provider  atomoxetine  (STRATTERA ) 40 MG capsule Take 40 mg by mouth in the morning.    [provider]  atomoxetine  (STRATTERA ) 60 MG capsule Take 60 mg by mouth daily. Patient not taking: Reported on 07/09/2023    [provider]  busPIRone  (BUSPAR ) 15 MG tablet Take 15 mg by mouth 3 (three) times daily. 07/31/21   [provider]  cetirizine  (ZYRTEC ) 10 MG tablet Take 1 tablet (10 mg total) by mouth daily. Please make appointment for further refills. 06/26/23   Azell Boll, MD  chlorproMAZINE  (THORAZINE ) 25 MG tablet Take 3 tablets (75 mg total) by mouth at bedtime. Patient taking differently: Take 75 mg by mouth 2 (two) times daily. 11/11/22   Carrion-Carrero, Jacalyn Martin, MD  chlorproMAZINE  (THORAZINE ) 50 MG tablet Take 1 tablet (50 mg total) by mouth in the morning. Patient taking differently: Take 50 mg by mouth at bedtime. 11/12/22   Carrion-Carrero, Jacalyn Martin, MD  docusate sodium  (COLACE) 100 MG capsule TAKE 1 CAPSULE BY MOUTH ONCE DAILY 08/27/23   Veronia Goon, DO   fluticasone  (FLONASE ) 50 MCG/ACT nasal spray Place 2 sprays into both nostrils daily. 09/18/23   Veronia Goon, DO  fluvoxaMINE  (LUVOX ) 100 MG tablet Take 100 mg by mouth 2 (two) times daily.    [provider]  GNP VITAMIN D3 EXTRA STRENGTH 25 MCG (1000 UT) tablet TAKE 1 TABLET BY MOUTH EVERY DAY 08/27/23   Veronia Goon, DO  hydrOXYzine  (ATARAX ) 25 MG tablet Take 25 mg by mouth 4 (four) times daily. Patient not taking: Reported on 07/09/2023 08/02/21   [provider]  hydrOXYzine  (ATARAX ) 50 MG tablet Take 50 mg by mouth as directed. 06/04/23   [provider]  melatonin 3 MG TABS tablet TAKE 1 TABLET BY MOUTH AT BEDTIME 08/27/23   Dahbura, Anton, DO      Allergies    Patient has no known allergies.    Review of Systems   Review of Systems  Physical Exam Updated Vital Signs BP (!) 122/92 (BP Location: Right Arm)   Pulse 86   Temp 98.2 F (36.8 C) (Oral)   Resp 18   Ht 6\' 3"  (1.905 m)   Wt 113.4 kg   SpO2 95%   BMI 31.25 kg/m  Physical Exam Vitals and nursing note reviewed.  Constitutional:      General: He is not in acute distress.    Appearance: He is well-developed. He is obese.  HENT:     Head: Normocephalic and atraumatic.  Eyes:     Conjunctiva/sclera: Conjunctivae  normal.  Cardiovascular:     Rate and Rhythm: Normal rate and regular rhythm.  Pulmonary:     Effort: Pulmonary effort is normal. No respiratory distress.     Breath sounds: No stridor.  Abdominal:     General: There is no distension.  Skin:    General: Skin is warm and dry.  Neurological:     Mental Status: He is alert and oriented to person, place, and time.  Psychiatric:     Comments: Little insight into his presentation, reports suicidal and homicidal thoughts only as above.     ED Results / Procedures / Treatments   Labs (all labs ordered are listed, but only abnormal results are displayed) Labs Reviewed  COMPREHENSIVE METABOLIC PANEL WITH GFR - Abnormal; Notable  for the following components:      Result Value   CO2 21 (*)    Glucose, Bld 108 (*)    ALT 84 (*)    Total Bilirubin 1.3 (*)    All other components within normal limits  ETHANOL  CBC  RAPID URINE DRUG SCREEN, HOSP PERFORMED    EKG None  Radiology No results found.  Procedures Procedures    Medications Ordered in ED Medications  busPIRone  (BUSPAR ) tablet 15 mg (has no administration in time range)  chlorproMAZINE  (THORAZINE ) tablet 75 mg (has no administration in time range)  acetaminophen  (TYLENOL ) tablet 650 mg (has no administration in time range)    ED Course/ Medical Decision Making/ A&P                                 Medical Decision Making Adult male presents from group home with suicidal and homicidal statements.  Patient is awake, alert, and now, removed from that environment seems to have less overt suicidal statements and no homicidal endorsement.  Patient is in no distress is medically cleared for behavioral evaluation Home meds ordered.  Amount and/or Complexity of Data Reviewed Independent Historian:     Details: police notes Labs: ordered. Decision-making details documented in ED Course.  Risk Prescription drug management. Decision regarding hospitalization. Diagnosis or treatment significantly limited by social determinants of health.  Final Clinical Impression(s) / ED Diagnoses Final diagnoses:  Suicidal ideation     Dorenda Gandy, MD 11/15/23 2037

## 2023-11-15 NOTE — ED Notes (Signed)
 Pt wanded by security. Belongings (clothing) labeled in bag and placed in cabinet.

## 2023-11-15 NOTE — ED Triage Notes (Addendum)
 Pt from Group home and began having SI and HI thoughts about 2 hours ago. Brought by [employer] but they are no longer with him. States no plan but just having thoughts. Dressed out. [facility] is name of group home.

## 2023-11-16 DIAGNOSIS — F4325 Adjustment disorder with mixed disturbance of emotions and conduct: Secondary | ICD-10-CM

## 2023-11-16 LAB — RAPID URINE DRUG SCREEN, HOSP PERFORMED
Amphetamines: NOT DETECTED
Barbiturates: NOT DETECTED
Benzodiazepines: NOT DETECTED
Cocaine: NOT DETECTED
Opiates: NOT DETECTED
Tetrahydrocannabinol: NOT DETECTED

## 2023-11-16 NOTE — Discharge Instructions (Addendum)
Continue home medications as prescribed.  Return to the emergency department for any new or worsening symptoms of concern. 

## 2023-11-16 NOTE — ED Provider Notes (Addendum)
 Emergency Medicine Observation Re-evaluation Note  Ray Carter is a 23 y.o. male, seen on rounds today.  Pt initially presented to the ED for complaints of Suicidal Currently, the patient is sleeping.  Physical Exam  BP 125/83   Pulse (!) 101   Temp 98 F (36.7 C)   Resp 18   Ht 6\' 3"  (1.905 m)   Wt 113.4 kg   SpO2 97%   BMI 31.25 kg/m  Physical Exam General: Sleeping Cardiac: Extremities well-perfused Lungs: Breathing is unlabored Psych: Deferred  ED Course / MDM  EKG:   I have reviewed the labs performed to date as well as medications administered while in observation.  Recent changes in the last 24 hours include presentation to the emergency department yesterday from group home for suicidal ideation.  He has been medically cleared.  TTS has evaluated and recommends continued ED observation.  Plan  Current plan is for TTS reevaluation.  After TTS reevaluation, patient has been psychiatrically cleared to return to group home.  Staff to pick him up at 11:30 AM.  Discharged in stable condition.    Iva Mariner, MD 11/16/23 5621    Iva Mariner, MD 11/16/23 1113
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
## 2023-11-16 NOTE — Consult Note (Signed)
 [facility] Psychiatric Consult Initial  Patient Name: .Ray Carter  MRN: [id number]  DOB: 2001-06-09  Consult Order details:  Orders (From admission, onward)     Start     Ordered   11/15/23 2035  CONSULT TO CALL ACT TEAM       Ordering Provider: Dorenda Gandy, MD  Provider:  (Not yet assigned)  Question:  Reason for Consult?  Answer:  Psych consult   11/15/23 2035             Mode of Visit: In person    Psychiatry Consult Evaluation  Service Date: Nov 16, 2023 LOS:  LOS: 0 days  Chief Complaint suicide /Homicidal ideation  Primary Psychiatric Diagnoses  Adjustment disorder with mixed disturbance of emotion and conduct 2.  Suicide ideation  Assessment  Ray Carter is a 23 y.o. male admitted: Presented to the EDfor 11/15/2023  6:40 PM for Suicide/Homicidal ideation. He carries the psychiatric diagnoses of  ODD, ADHD, ASD, and DMDD.  and has a past medical history of  Overweight.   His current presentation of suicide /homicidal ideation  is most consistent with previous complaints . He meets criteria for Discharge back to his group home  based on his presentation this morning and previous hx of same complaints..  Current outpatient psychotropic medications include Buspirone , Thorazine   and historically he has had a positive response to these medications. He was  compliant with medications prior to admission as evidenced by his report and his mental status this morning. On initial examination, patient was calm, smiling and engaged in meaningful conversation.. Please see plan below for detailed recommendations.   Diagnoses:  Active Hospital problems: Principal Problem:   Adjustment disorder with mixed disturbance of emotions and conduct Active Problems:   Suicidal ideation    Plan   ## Psychiatric Medication Recommendations:  Continue current medications at home.  ## Medical Decision Making Capacity: Patient is his own guardian   ## Further Work-up:  -- most  recent EKG on 11/15/2023 had QtC of 405 -- Pertinent labwork reviewed earlier this admission includes: CBC, CMP, Alcohol, UDS   ## Disposition:-- There are no psychiatric contraindications to discharge at this time  ## Behavioral / Environmental: - No specific recommendations at this time.     ## Safety and Observation Level:  - Based on my clinical evaluation, I estimate the patient to be at no risk of self harm in the current setting. - At this time, we recommend  routine. This decision is based on my review of the chart including patient's history and current presentation, interview of the patient, mental status examination, and consideration of suicide risk including evaluating suicidal ideation, plan, intent, suicidal or self-harm behaviors, risk factors, and protective factors. This judgment is based on our ability to directly address suicide risk, implement suicide prevention strategies, and develop a safety plan while the patient is in the clinical setting. Please contact our team if there is a concern that risk level has changed.  CSSR Risk Category:C-SSRS RISK CATEGORY: Low Risk  Suicide Risk Assessment: Patient has following modifiable risk factors for suicide: recklessness, which we are addressing by Discharge back to group home, engage in day program, take prescribed Medication.. Patient has following non-modifiable or demographic risk factors for suicide: male gender Patient has the following protective factors against suicide: Access to outpatient mental health care, Supportive family, Supportive friends, and no history of suicide attempts  Thank you for this consult request. Recommendations have been communicated to the primary  team.  We will Psychiatrically clear patient and sign off at this time.   Khelani Kops C Layney Gillson, NP-PMHNP-BC       History of Present Illness  Relevant Aspects of Hospital ED Course:  Admitted on 11/15/2023 for Suicidal/Homicidal ideation  Patient is a  male, 23 years old seen this morning for revaluation.  Patient was brought in by [employer] from the group home he stays in for suicide/Homicidal ideation.  This is a known behavior of this patient who reports this morning that he was angry yesterday because he has not been able to see his family members.  He states that he takes his frustration to the staff of the group home.  Patient also added" I like to come to the ER.  You all are good to me, I like it here"  Patient denies SI/HI/AVH.  Patient actually requested to go back to his group home because he has missed day program today .  Patient is well nourished, well groomed and was smiling speaking to provider.  He reports taking his Medications as prescribed.  He reports great sleep and appetite is good.  Patient is Psychiatrically cleared to go back to group home.   Psych ROS:  Depression: no Anxiety:  no Mania (lifetime and current): na Psychosis: (lifetime and current): na  Collateral information:  Contacted-Group staff will be picking patient up soon today.  Review of Systems  Constitutional: Negative.   HENT: Negative.    Eyes: Negative.   Respiratory: Negative.    Cardiovascular: Negative.   Gastrointestinal: Negative.   Genitourinary: Negative.   Musculoskeletal: Negative.   Skin: Negative.   Neurological: Negative.   Endo/Heme/Allergies: Negative.   Psychiatric/Behavioral:  Positive for suicidal ideas.      Psychiatric and Social History  Psychiatric History:  Information collected from patient/Medical record  Prev Dx/Sx: see above Current Psych Provider: yes Home Meds (current): see above Previous Med Trials: unknown Therapy: -Day Program M-F  Prior Psych Hospitalization: na  Prior Self Harm: na Prior Violence: yes  Family Psych History: unknown Family Hx suicide: unknown  Social History:  Developmental Hx: -Mild Cog impairment-he is his own Guardian Educational Hx: HS Occupational Hx: NA Legal Hx: Denies Living  Situation: Group home Spiritual Hx: denies Access to weapons/lethal means: Denies   Substance History Alcohol: denies  Tobacco: Denies Illicit drugs: Denies- UDS is negative Prescription drug abuse: denies Rehab hx: na  Exam Findings  Physical Exam:  Vital Signs:  Temp:  [98 F (36.7 C)-98.2 F (36.8 C)] 98 F (36.7 C) (05/19 0539) Pulse Rate:  [86-101] 101 (05/19 0539) Resp:  [18] 18 (05/19 0539) BP: (122-125)/(83-92) 125/83 (05/19 0539) SpO2:  [95 %-97 %] 97 % (05/19 0539) Weight:  [113.4 kg] 113.4 kg (05/18 1850) Blood pressure 125/83, pulse (!) 101, temperature 98 F (36.7 C), resp. rate 18, height 6\' 3"  (1.905 m), weight 113.4 kg, SpO2 97%. Body mass index is 31.25 kg/m.  Physical Exam Vitals and nursing note reviewed.  Constitutional:      Appearance: Normal appearance.  HENT:     Nose: Nose normal.  Cardiovascular:     Rate and Rhythm: Normal rate and regular rhythm.  Pulmonary:     Effort: Pulmonary effort is normal.  Musculoskeletal:        General: Normal range of motion.  Skin:    General: Skin is dry.  Neurological:     Mental Status: He is alert and oriented to person, place, and time.  Psychiatric:  Attention and Perception: Attention and perception normal.        Speech: Speech normal.        Behavior: Behavior normal. Behavior is cooperative.        Thought Content: Thought content normal.        Cognition and Memory: Cognition and memory normal.        Judgment: Judgment normal.     Mental Status Exam: General Appearance: Casual and Well Groomed  Orientation:  Full (Time, Place, and Person)  Memory:  Immediate;   Good Recent;   Good Remote;   Good  Concentration:  Concentration: Good and Attention Span: Good  Recall:  Good  Attention  Good  Eye Contact:  Good  Speech:  Clear and Coherent  Language:  Good  Volume:  Normal  Mood: "GOOD, I want to go back to my group home"  Affect:  Appropriate and Congruent  Thought Process:   Coherent  Thought Content:  Logical  Suicidal Thoughts:  No  Homicidal Thoughts:  No  Judgement:  Fair  Insight:  Good  Psychomotor Activity:  Normal  Akathisia:  NA  Fund of Knowledge:  Good      Assets:  Communication Skills Desire for Improvement Housing Leisure Time Physical Health Social Support  Cognition:  WNL  ADL's:  Intact  AIMS (if indicated):        Other History   These have been pulled in through the EMR, reviewed, and updated if appropriate.  Family History:  The patient's family history is not on file. He was adopted.  Medical History: Past Medical History:  Diagnosis Date   Adjustment disorder with mixed disturbance of emotions and conduct    Anxiety    Attention deficit hyperactivity disorder (ADHD), combined type    Autism    Disruptive mood dysregulation disorder (HCC)    History of Aggressive behavior     Surgical History: History reviewed. No pertinent surgical history.   Medications:   Current Facility-Administered Medications:    acetaminophen  (TYLENOL ) tablet 650 mg, 650 mg, Oral, Q4H PRN, Dorenda Gandy, MD   busPIRone  (BUSPAR ) tablet 15 mg, 15 mg, Oral, TID, Dorenda Gandy, MD, 15 mg at 11/15/23 2149   chlorproMAZINE  (THORAZINE ) tablet 50 mg, 50 mg, Oral, Daily, Dorenda Gandy, MD   chlorproMAZINE  (THORAZINE ) tablet 75 mg, 75 mg, Oral, QHS, Dorenda Gandy, MD, 75 mg at 11/15/23 2150  Current Outpatient Medications:    atomoxetine  (STRATTERA ) 40 MG capsule, Take 40 mg by mouth in the morning., Disp: , Rfl:    atomoxetine  (STRATTERA ) 60 MG capsule, Take 60 mg by mouth daily. (Patient not taking: Reported on 07/09/2023), Disp: , Rfl:    busPIRone  (BUSPAR ) 15 MG tablet, Take 15 mg by mouth 3 (three) times daily., Disp: , Rfl:    cetirizine  (ZYRTEC ) 10 MG tablet, Take 1 tablet (10 mg total) by mouth daily. Please make appointment for further refills., Disp: 30 tablet, Rfl: 11   chlorproMAZINE  (THORAZINE ) 25 MG tablet, Take 3 tablets  (75 mg total) by mouth at bedtime. (Patient taking differently: Take 75 mg by mouth 2 (two) times daily.), Disp: 30 tablet, Rfl: 0   chlorproMAZINE  (THORAZINE ) 50 MG tablet, Take 1 tablet (50 mg total) by mouth in the morning. (Patient taking differently: Take 50 mg by mouth at bedtime.), Disp: 30 tablet, Rfl: 0   docusate sodium  (COLACE) 100 MG capsule, TAKE 1 CAPSULE BY MOUTH ONCE DAILY, Disp: 30 capsule, Rfl: 9   fluticasone  (FLONASE ) 50 MCG/ACT nasal  spray, Place 2 sprays into both nostrils daily., Disp: 16 g, Rfl: 3   fluvoxaMINE  (LUVOX ) 100 MG tablet, Take 100 mg by mouth 2 (two) times daily., Disp: , Rfl:    GNP VITAMIN D3 EXTRA STRENGTH 25 MCG (1000 UT) tablet, TAKE 1 TABLET BY MOUTH EVERY DAY, Disp: 30 tablet, Rfl: 9   hydrOXYzine  (ATARAX ) 25 MG tablet, Take 25 mg by mouth 4 (four) times daily. (Patient not taking: Reported on 07/09/2023), Disp: , Rfl:    hydrOXYzine  (ATARAX ) 50 MG tablet, Take 50 mg by mouth as directed., Disp: , Rfl:    melatonin 3 MG TABS tablet, TAKE 1 TABLET BY MOUTH AT BEDTIME, Disp: 30 tablet, Rfl: 10  Allergies: No Known Allergies  Alette Kataoka C Hermelinda Diegel, NP-PMHNP-BC

## 2023-11-16 NOTE — ED Notes (Addendum)
 [facility] called [employer], group home owner to confirm that pt is able to return to his group home. Natasha connected [facility] to [employer] (Uncle Pat), [facility] administrator who confirmed that pt can return today and will be picked up at 11:30am.  Patt Boozer, [facility]  11/16/23

## 2023-11-29 ENCOUNTER — Ambulatory Visit (HOSPITAL_COMMUNITY)
Admission: EM | Admit: 2023-11-29 | Discharge: 2023-11-30 | Disposition: A | Discharge status: Other Acute Inpatient Hospital | Payer: MEDICAID | Attending: Behavioral Health | Admitting: Behavioral Health

## 2023-11-29 DIAGNOSIS — F840 Autistic disorder: Secondary | ICD-10-CM | POA: Insufficient documentation

## 2023-11-29 DIAGNOSIS — R4689 Other symptoms and signs involving appearance and behavior: Secondary | ICD-10-CM

## 2023-11-29 LAB — LIPID PANEL
Cholesterol: 170 mg/dL (ref 0–200)
HDL: 28 mg/dL — ABNORMAL LOW (ref >40)
LDL Cholesterol: 110 mg/dL — ABNORMAL HIGH (ref 0–99)
Total CHOL/HDL Ratio: 6.1 ratio
Triglycerides: 161 mg/dL — ABNORMAL HIGH (ref <150)
VLDL: 32 mg/dL (ref 0–40)

## 2023-11-29 LAB — CBC WITH DIFFERENTIAL/PLATELET
Abs Immature Granulocytes: 0.04 10*3/uL (ref 0.00–0.07)
Basophils Absolute: 0.1 10*3/uL (ref 0.0–0.1)
Basophils Relative: 1 %
Eosinophils Absolute: 0.1 10*3/uL (ref 0.0–0.5)
Eosinophils Relative: 1 %
HCT: 55.5 % — ABNORMAL HIGH (ref 39.0–52.0)
Hemoglobin: 18.4 g/dL — ABNORMAL HIGH (ref 13.0–17.0)
Immature Granulocytes: 1 %
Lymphocytes Relative: 29 %
Lymphs Abs: 2.2 10*3/uL (ref 0.7–4.0)
MCH: 26.1 pg (ref 26.0–34.0)
MCHC: 33.2 g/dL (ref 30.0–36.0)
MCV: 78.6 fL — ABNORMAL LOW (ref 80.0–100.0)
Monocytes Absolute: 0.8 10*3/uL (ref 0.1–1.0)
Monocytes Relative: 11 %
Neutro Abs: 4.5 10*3/uL (ref 1.7–7.7)
Neutrophils Relative %: 57 %
Platelets: 300 10*3/uL (ref 150–400)
RBC: 7.06 MIL/uL — ABNORMAL HIGH (ref 4.22–5.81)
RDW: 15.7 % — ABNORMAL HIGH (ref 11.5–15.5)
WBC: 7.7 10*3/uL (ref 4.0–10.5)
nRBC: 0 % (ref 0.0–0.2)

## 2023-11-29 LAB — ETHANOL: Alcohol, Ethyl (B): <15 mg/dL (ref <15)

## 2023-11-29 LAB — POCT URINE DRUG SCREEN - MANUAL ENTRY (I-SCREEN)
POC Amphetamine UR: NOT DETECTED
POC Buprenorphine (BUP): NOT DETECTED
POC Cocaine UR: NOT DETECTED
POC Marijuana UR: NOT DETECTED
POC Methadone UR: NOT DETECTED
POC Methamphetamine UR: NOT DETECTED
POC Morphine: NOT DETECTED
POC Oxazepam (BZO): POSITIVE — AB
POC Oxycodone UR: NOT DETECTED
POC Secobarbital (BAR): NOT DETECTED

## 2023-11-29 LAB — COMPREHENSIVE METABOLIC PANEL WITH GFR
ALT: 59 U/L — ABNORMAL HIGH (ref 0–44)
AST: 29 U/L (ref 15–41)
Albumin: 4.3 g/dL (ref 3.5–5.0)
Alkaline Phosphatase: 61 U/L (ref 38–126)
Anion gap: 11 (ref 5–15)
BUN: 12 mg/dL (ref 6–20)
CO2: 26 mmol/L (ref 22–32)
Calcium: 10.1 mg/dL (ref 8.9–10.3)
Chloride: 102 mmol/L (ref 98–111)
Creatinine, Ser: 1.18 mg/dL (ref 0.61–1.24)
GFR, Estimated: >60 mL/min (ref >60)
Glucose, Bld: 93 mg/dL (ref 70–99)
Potassium: 4.3 mmol/L (ref 3.5–5.1)
Sodium: 139 mmol/L (ref 135–145)
Total Bilirubin: 1 mg/dL (ref 0.0–1.2)
Total Protein: 7.4 g/dL (ref 6.5–8.1)

## 2023-11-29 LAB — HEMOGLOBIN A1C
Hgb A1c MFr Bld: 5.5 % (ref 4.8–5.6)
Mean Plasma Glucose: 111.15 mg/dL

## 2023-11-29 MED ORDER — HALOPERIDOL LACTATE 5 MG/ML IJ SOLN: 10.0000 mg | Freq: Three times a day (TID) | INTRAMUSCULAR | Status: DC | PRN

## 2023-11-29 MED ORDER — DIPHENHYDRAMINE HCL 50 MG PO CAPS: 50.0000 mg | ORAL_CAPSULE | Freq: Three times a day (TID) | ORAL | Status: DC | PRN

## 2023-11-29 MED ORDER — LORAZEPAM 2 MG/ML IJ SOLN: 2.0000 mg | Freq: Three times a day (TID) | INTRAMUSCULAR | Status: DC | PRN

## 2023-11-29 MED ORDER — DIPHENHYDRAMINE HCL 50 MG/ML IJ SOLN: 50.0000 mg | Freq: Three times a day (TID) | INTRAMUSCULAR | Status: DC | PRN

## 2023-11-29 MED ORDER — HALOPERIDOL LACTATE 5 MG/ML IJ SOLN: 5.0000 mg | Freq: Three times a day (TID) | INTRAMUSCULAR | Status: DC | PRN

## 2023-11-29 MED ORDER — HALOPERIDOL 5 MG PO TABS: 5.0000 mg | ORAL_TABLET | Freq: Three times a day (TID) | ORAL | Status: DC | PRN

## 2023-11-29 MED ORDER — TRAZODONE HCL 50 MG PO TABS
50.0000 mg | ORAL_TABLET | Freq: Every evening | ORAL | Status: DC | PRN
  Administered 2023-11-29: 50 mg via ORAL
  Filled 2023-11-29: qty 1

## 2023-11-29 MED ORDER — ACETAMINOPHEN 325 MG PO TABS: 650.0000 mg | ORAL_TABLET | Freq: Four times a day (QID) | ORAL | Status: DC | PRN

## 2023-11-29 MED ORDER — HYDROXYZINE HCL 25 MG PO TABS
25.0000 mg | ORAL_TABLET | Freq: Three times a day (TID) | ORAL | Status: DC | PRN
  Administered 2023-11-29: 25 mg via ORAL
  Filled 2023-11-29: qty 1

## 2023-11-29 NOTE — ED Notes (Addendum)
 Pt a/o, pleasant and cooperative. Denies SI/HI/AVH at present time. He is guarded but contracts for safety.  No noted distress. Will continue to  monitor for safety.

## 2023-11-29 NOTE — ED Notes (Signed)
 Patient admitted to adult obs for SI with a plan to stab himself. States his SI thoughts have been getting worse. Patient is calm and cooperative. Skin check conducted by this nurse and Doylene Genet, MHT. Patient oriented to the unit and provided dinner and beverage. Once on the unit pt stated "Ray Carter been here before, I know how everything works. When I was here last time I acted a fool." This nurse encouraged pt to make this time better than the last. Pt stated "I don't know." He denies physical pain or discomfort, or any additional needs at this time. We will continue to monitor for safety.

## 2023-11-29 NOTE — Discharge Instructions (Addendum)
 Autism Services/Providers  The following are clinicians within [facility] who are supposed to be specialized in working with individuals who have autism, according the [facility] Provider Directory- [email or web address].  Quintella Buck Gagne [address] [place], [place], [address] [phone] phone  Nada Auer [address] [place], [place], [address] [phone] phone  Cleora Daft [id number] [place], [place], [address] [phone] phone  Jacob Master [address] [place], [place], [address] [phone] phone  Vickie Hubert Madden [address] [place], [place], [address] [phone] phone  Nevelyn Barber Cantrell [address] [place], [place], [address] [phone] phone  [facility], [place] [address], [place], [address] [phone] phone

## 2023-11-29 NOTE — Progress Notes (Signed)
   11/29/23 1643  [facility] Triage Screening (Walk-ins at [facility] only)  What Is the Reason for Your Visit/Call Today? Pt presents to [facility] voluntarily via [employer]. Pt was joking and laughing with the officer when in the back sally port. Pt states he is actively endorsing SI with a plan to stab himself with a knife. Pt denies HI, AVH, Abuse, Alcohol and Drug use.  How Long Has This Been Causing You Problems? 1 wk - 1 month  Have You Recently Had Any Thoughts About Hurting Yourself? Yes  How long ago did you have thoughts about hurting yourself? now - plan to stab himself with a knife  Are You Planning to Commit Suicide/Harm Yourself At This time? Yes  Have you Recently Had Thoughts About Hurting Someone [name]? No  Are You Planning To Harm Someone At This Time? No  Physical Abuse Denies  Verbal Abuse Denies  Sexual Abuse Denies  Exploitation of patient/patient's resources Denies  Self-Neglect Denies  Are you currently experiencing any auditory, visual or other hallucinations? No  Have You Used Any Alcohol or Drugs in the Past 24 Hours? No  Do you have any current medical co-morbidities that require immediate attention? No  What Do You Feel Would Help You the Most Today? Treatment for Depression or other mood problem  Determination of Need Emergent (2 hours)  Options For Referral Inpatient Hospitalization;Intensive Outpatient Therapy;Medication Management;Outpatient Therapy;Therapeutic Triage Services  Determination of Need filed? Yes
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
## 2023-11-29 NOTE — ED Provider Notes (Addendum)
 [facility] Continuous Assessment Admission H&P  Date: 11/29/23 Patient Name: Ray Carter MRN: [id number]  Chief Complaint: Per Triage, "Pt presents to [facility] voluntarily via [employer]. Pt was joking and laughing with the officer when in the back sally port. Pt states he is actively endorsing SI with a plan to stab himself with a knife. Pt denies HI, AVH, Abuse, Alcohol and Drug use."  Diagnoses:  Final diagnoses:  Aggressive behavior of adult  H/O autism spectrum disorder    HPI: Ray Carter is a 23 year old male patient with a past psychiatric history significant for mild cognitive disorder, ADHD, adjustment disorder, autism, aggression and chronic suicidal ideations who presented to the [facility] voluntary accompanied by police with complaints of "suicidal thoughts with a plan to stab myself."   On approach, patient states that he called the police because he had a thought to stab himself. He states that he got mad today and destroyed the TV by picking it up and throwing it because he was mad he could not put the TV in his room to play his video games.   He states that he has been suicidal on and off for years and briefly had a thought today before he called the police. He denies past suicide attempts. He reports self injurious behaviors by trying to bust his head open years ago.    He described his mood as feeling depressed. However, his affect is incongruent and he is noted to be laughing, smiling and joking around during the evaluation. He is unable to describe his depressive symptoms and says yes to feeling sad, hopeless, worthless, irritable, and decreased energy, stating that he sleeps a lot. He reports a good appetite, and states that he eats too much. He denies auditory or visual hallucinations. Objectively, no signs of acute psychosis. He denies homicidal thoughts. He is calm and cooperative on exam.   Patient states that he has been living at the  [facility] group home for 4 years. He states that Ilda Malkin is the group home owner. He reports taking prescribed medication but is unable to recall the name of the medications that he is taking. He denies outpatient therapy and states that he needs a therapist. He denies experimenting with drugs or alcohol.  I contacted [facility] [phone] three times this evening to obtain collateral information and to verify home medications, no answer.    Total Time spent with patient: 30 minutes  Musculoskeletal  Strength & Muscle Tone: within normal limits Gait & Station: normal Patient leans: N/A  Psychiatric Specialty Exam  Presentation General Appearance:  Appropriate for Environment  Eye Contact: Fair  Speech: Clear and Coherent  Speech Volume: Normal  Handedness: Right   Mood and Affect  Mood: Depressed  Affect: Non-Congruent   Thought Process  Thought Processes: Coherent  Descriptions of Associations:Intact  Orientation:Full (Time, Place and Person)  Thought Content:Logical  Diagnosis of Schizophrenia or Schizoaffective disorder in past: No   Hallucinations:Hallucinations: None  Ideas of Reference:None  Suicidal Thoughts:Suicidal Thoughts: Yes, Active  Homicidal Thoughts:Homicidal Thoughts: No   Sensorium  Memory: Recent Fair; Remote Fair  Judgment: Intact  Insight: Shallow   Executive Functions  Concentration: Fair  Attention Span: Fair  Recall: [employer] of Knowledge: Fair  Language: Fair   Psychomotor Activity  Psychomotor Activity: Psychomotor Activity: Normal   Assets  Assets: Communication Skills; Desire for Improvement; Housing; Social Support   Sleep  Sleep: Sleep: Fair Number of Hours of  Sleep: 10   Nutritional Assessment (For OBS and FBC admissions only) Has the patient had a weight loss or gain of 10 pounds or more in the last 3 months?: No Has the patient had a decrease in food intake/or  appetite?: No Does the patient have dental problems?: No Does the patient have eating habits or behaviors that may be indicators of an eating disorder including binging or inducing vomiting?: No Has the patient recently lost weight without trying?: 0 Has the patient been eating poorly because of a decreased appetite?: 0 Malnutrition Screening Tool Score: 0    Physical Exam Cardiovascular:     Rate and Rhythm: Normal rate.  Pulmonary:     Effort: Pulmonary effort is normal.  Musculoskeletal:     Cervical back: Normal range of motion.  Neurological:     Mental Status: He is alert and oriented to person, place, and time.    Review of Systems  Constitutional: Negative.   HENT: Negative.    Eyes: Negative.   Respiratory: Negative.    Cardiovascular: Negative.   Gastrointestinal: Negative.   Genitourinary: Negative.   Musculoskeletal: Negative.   Neurological: Negative.   Endo/Heme/Allergies: Negative.     Blood pressure 126/86, pulse 81, temperature 98.4 F (36.9 C), temperature source Oral, resp. rate 18, SpO2 97%. There is no height or weight on file to calculate BMI.  Past Psychiatric History: mild cognitive disorder, ADHD, adjustment disorder, autism, aggression and chronic suicidal ideations  Is the patient at risk to self? Yes  Has the patient been a risk to self in the past 6 months? Yes .    Has the patient been a risk to self within the distant past? Yes   Is the patient a risk to others? Yes   Has the patient been a risk to others in the past 6 months? No   Has the patient been a risk to others within the distant past? Yes   Past Medical History: No reported history.  Family History: No reported history.   Social History: Patient states that he has been living at the [facility] group home for 4 years. He states that Ilda Malkin is the group home owner. He denies experimenting with drugs or alcohol.  Last Labs:  Admission on 11/29/2023  Component Date Value Ref  Range Status   POC Amphetamine UR 11/29/2023 None Detected  NONE DETECTED (Cut Off Level 1000 ng/mL) Final   POC Secobarbital (BAR) 11/29/2023 None Detected  NONE DETECTED (Cut Off Level 300 ng/mL) Final   POC Buprenorphine (BUP) 11/29/2023 None Detected  NONE DETECTED (Cut Off Level 10 ng/mL) Final   POC Oxazepam (BZO) 11/29/2023 Positive (A)  NONE DETECTED (Cut Off Level 300 ng/mL) Final   POC Cocaine UR 11/29/2023 None Detected  NONE DETECTED (Cut Off Level 300 ng/mL) Final   POC Methamphetamine UR 11/29/2023 None Detected  NONE DETECTED (Cut Off Level 1000 ng/mL) Final   POC Morphine 11/29/2023 None Detected  NONE DETECTED (Cut Off Level 300 ng/mL) Final   POC Methadone UR 11/29/2023 None Detected  NONE DETECTED (Cut Off Level 300 ng/mL) Final   POC Oxycodone UR 11/29/2023 None Detected  NONE DETECTED (Cut Off Level 100 ng/mL) Final   POC Marijuana UR 11/29/2023 None Detected  NONE DETECTED (Cut Off Level 50 ng/mL) Final  Admission on 11/15/2023, Discharged on 11/16/2023  Component Date Value Ref Range Status   Sodium 11/15/2023 137  135 - 145 mmol/L Final   Potassium 11/15/2023 3.9  3.5 -  5.1 mmol/L Final   Chloride 11/15/2023 104  98 - 111 mmol/L Final   CO2 11/15/2023 21 (L)  22 - 32 mmol/L Final   Glucose, Bld 11/15/2023 108 (H)  70 - 99 mg/dL Final   Glucose reference range applies only to samples taken after fasting for at least 8 hours.   BUN 11/15/2023 11  6 - 20 mg/dL Final   Creatinine, Ser 11/15/2023 0.93  0.61 - 1.24 mg/dL Final   Calcium 40/98/1191 9.6  8.9 - 10.3 mg/dL Final   Total Protein 47/82/9562 7.8  6.5 - 8.1 g/dL Final   Albumin 13/01/6577 4.3  3.5 - 5.0 g/dL Final   AST 46/96/2952 40  15 - 41 U/L Final   ALT 11/15/2023 84 (H)  0 - 44 U/L Final   Alkaline Phosphatase 11/15/2023 69  38 - 126 U/L Final   Total Bilirubin 11/15/2023 1.3 (H)  0.0 - 1.2 mg/dL Final   GFR, Estimated 11/15/2023 >60  >60 mL/min Final   Comment: (NOTE) Calculated using the CKD-EPI  Creatinine Equation (2021)    Anion gap 11/15/2023 12  5 - 15 Final   Performed at [facility], [address], [place], [place] [address]   Alcohol, Ethyl (B) 11/15/2023 <15  <15 mg/dL Final   Comment: Please note change in reference range. (NOTE) For medical purposes only. Performed at [facility], [address], [place], [place] [address]    WBC 11/15/2023 7.9  4.0 - 10.5 K/uL Final   RBC 11/15/2023 7.20 (H)  4.22 - 5.81 MIL/uL Final   Hemoglobin 11/15/2023 18.5 (H)  13.0 - 17.0 g/dL Final   HCT 27/25/3664 57.0 (H)  39.0 - 52.0 % Final   MCV 11/15/2023 79.2 (L)  80.0 - 100.0 fL Final   MCH 11/15/2023 25.7 (L)  26.0 - 34.0 pg Final   MCHC 11/15/2023 32.5  30.0 - 36.0 g/dL Final   RDW 40/34/7425 15.2  11.5 - 15.5 % Final   Platelets 11/15/2023 306  150 - 400 K/uL Final   nRBC 11/15/2023 0.0  0.0 - 0.2 % Final   Performed at [facility], [address], [place], [place] [address]   Opiates 11/16/2023 NONE DETECTED  NONE DETECTED Final   Cocaine 11/16/2023 NONE DETECTED  NONE DETECTED Final   Benzodiazepines 11/16/2023 NONE DETECTED  NONE DETECTED Final   Amphetamines 11/16/2023 NONE DETECTED  NONE DETECTED Final   Tetrahydrocannabinol 11/16/2023 NONE DETECTED  NONE DETECTED Final   Barbiturates 11/16/2023 NONE DETECTED  NONE DETECTED Final   Comment: (NOTE) DRUG SCREEN FOR MEDICAL PURPOSES ONLY.  IF CONFIRMATION IS NEEDED FOR ANY PURPOSE, NOTIFY LAB WITHIN 5 DAYS.  LOWEST DETECTABLE LIMITS FOR URINE DRUG SCREEN Drug Class                     Cutoff (ng/mL) Amphetamine and metabolites    1000 Barbiturate and metabolites    200 Benzodiazepine                 200 Opiates and metabolites        300 Cocaine and metabolites        300 THC                            50 Performed at [facility], [address] [place], [place] [address]   Admission on 06/15/2023, Discharged on 06/16/2023  Component Date Value Ref Range Status   Sodium 06/15/2023 138  135 - 145 mmol/L Final   Potassium 06/15/2023 3.7  3.5 - 5.1 mmol/L Final   Chloride 06/15/2023 103  98 - 111 mmol/L Final   CO2 06/15/2023 25  22 - 32 mmol/L Final   Glucose, Bld 06/15/2023 79  70 - 99 mg/dL Final   Glucose reference range applies only to samples taken after fasting for at least 8 hours.   BUN 06/15/2023 15  6 - 20 mg/dL Final   Creatinine, Ser 06/15/2023 1.06  0.61 - 1.24 mg/dL Final   Calcium 40/98/1191 9.3  8.9 - 10.3 mg/dL Final   Total Protein 47/82/9562 7.8  6.5 - 8.1 g/dL Final   Albumin 13/01/6577 4.4  3.5 - 5.0 g/dL Final   AST 46/96/2952 35  15 - 41 U/L Final   ALT 06/15/2023 69 (H)  0 - 44 U/L Final   Alkaline Phosphatase 06/15/2023 60  38 - 126 U/L Final   Total Bilirubin 06/15/2023 1.1  <1.2 mg/dL Final   GFR, Estimated 06/15/2023 >60  >60 mL/min Final   Comment: (NOTE) Calculated using the CKD-EPI Creatinine Equation (2021)    Anion gap 06/15/2023 10  5 - 15 Final   Performed at [facility], [address], [place], [place] [address]   Alcohol, Ethyl (B) 06/15/2023 <10  <10 mg/dL Final   Comment: (NOTE) Lowest detectable limit for serum alcohol is 10 mg/dL.  For medical purposes only. Performed at [facility], [address], [place], [place] [address]    Salicylate Lvl 06/15/2023 <7.0 (L)  7.0 - 30.0 mg/dL Final   Performed at [facility], [address] [place], [place] [address]   Acetaminophen  (Tylenol ), Serum 06/15/2023 <10 (L)  10 - 30 ug/mL Final   Comment: (NOTE) Therapeutic concentrations vary significantly. A range of 10-30 ug/mL  may be an effective concentration for many patients. However, some  are best treated at concentrations outside of this range. Acetaminophen  concentrations >150 ug/mL at 4 hours after ingestion  and >50 ug/mL at 12 hours after ingestion are often associated with  toxic  reactions.  Performed at [facility], [address], [place], [place] [address]    WBC 06/15/2023 8.8  4.0 - 10.5 K/uL Final   RBC 06/15/2023 6.47 (H)  4.22 - 5.81 MIL/uL Final   Hemoglobin 06/15/2023 17.8 (H)  13.0 - 17.0 g/dL Final   HCT 34/74/2595 52.2 (H)  39.0 - 52.0 % Final   MCV 06/15/2023 80.7  80.0 - 100.0 fL Final   MCH 06/15/2023 27.5  26.0 - 34.0 pg Final   MCHC 06/15/2023 34.1  30.0 - 36.0 g/dL Final   RDW 63/87/5643 13.9  11.5 - 15.5 % Final   Platelets 06/15/2023 260  150 - 400 K/uL Final   nRBC 06/15/2023 0.0  0.0 - 0.2 % Final   Performed at [facility], [address], [place], [place] [address]    Allergies: Patient has no known allergies.  Medications:  Facility Ordered Medications  Medication   haloperidol (HALDOL) tablet 5 mg   And   diphenhydrAMINE  (BENADRYL ) capsule 50 mg   haloperidol lactate (HALDOL) injection 5 mg   And   diphenhydrAMINE  (BENADRYL ) injection 50 mg   And   LORazepam  (ATIVAN ) injection 2 mg   haloperidol lactate (HALDOL) injection 10 mg   And   diphenhydrAMINE  (BENADRYL ) injection 50 mg   And  LORazepam  (ATIVAN ) injection 2 mg   hydrOXYzine  (ATARAX ) tablet 25 mg   traZODone  (DESYREL ) tablet 50 mg   acetaminophen  (TYLENOL ) tablet 650 mg   PTA Medications  Medication Sig   fluvoxaMINE  (LUVOX ) 100 MG tablet Take 100 mg by mouth 2 (two) times daily.   busPIRone  (BUSPAR ) 15 MG tablet Take 15 mg by mouth 3 (three) times daily.   chlorproMAZINE  (THORAZINE ) 25 MG tablet Take 3 tablets (75 mg total) by mouth at bedtime. (Patient taking differently: Take 75 mg by mouth 2 (two) times daily.)   atomoxetine  (STRATTERA ) 60 MG capsule Take 60 mg by mouth daily.   cetirizine  (ZYRTEC ) 10 MG tablet Take 1 tablet (10 mg total) by mouth daily. Please make appointment for further refills.   hydrOXYzine  (ATARAX ) 50 MG tablet Take 50 mg by mouth 3 (three) times daily as needed for anxiety.   melatonin 3 MG  TABS tablet TAKE 1 TABLET BY MOUTH AT BEDTIME   docusate sodium  (COLACE) 100 MG capsule TAKE 1 CAPSULE BY MOUTH ONCE DAILY   GNP VITAMIN D3 EXTRA STRENGTH 25 MCG (1000 UT) tablet TAKE 1 TABLET BY MOUTH EVERY DAY   fluticasone  (FLONASE ) 50 MCG/ACT nasal spray Place 2 sprays into both nostrils daily.      Medical Decision Making  Grover Robinson is a 23 year old male patient with a history of autism, aggression and chronic suicidal ideations who presented with suicidal thoughts to stab himself after he reportedly threw the TV at the [facility] group home. This provider attempted to contact the group home owner [employer]ash[employer] to obtain collateral information and verify home medications however, no answer. Patient to be admitted to the [facility] behavioral health urgent care continuous assessment unit overnight for observation and mood stabilization. Patient could benefit from establishing outpatient counseling to work on problem solving, and learn healthy coping skills considering extensive history of aggression and SI.   Lab Orders         CBC with Differential/Platelet         Comprehensive metabolic panel         Hemoglobin A1c         Ethanol         Lipid panel         POCT Urine Drug Screen - (I-Screen)    EKG     Recommendations  Based on my evaluation the patient does not appear to have an emergency medical condition.  Gwenlyn Lento, NP 11/29/23  5:49 PM

## 2023-11-30 NOTE — ED Notes (Signed)
 Pt observed/assessed in recliner bed, awake. RR even and unlabored, appearing in no noted distress. Environmental check complete, will continue to monitor for safety

## 2023-11-30 NOTE — Discharge Summary (Signed)
 [name] transferred to [facility] per MD order. Discussed with the patient and all questions fully answered. An After Visit Summary, EMTALA and Med Necessity forms were printed and to be given to the receiving nurse. All belongings returned. Patient escorted out and transferred via safe transport. Minus Amel  11/30/2023 4:24 PM

## 2023-11-30 NOTE — ED Provider Notes (Addendum)
 Behavioral Health Progress Note  Date and Time: 12/01/2023 6:40 AM Name: Ray Carter MRN:  [id number]  Rogen Porte is a 23 y.o. male with past history of autism spectrum disorder and  aggression who presents with suicidal thoughts and aggressive behavior including breaking a TV at his group home.  Subjective:   During initial part of interview patient is making inappropriate comments:  He reports that he is going to "act up" if he is not getting mental health care, to which responded that it is our goal is to help him and advocate for what is best for him whether outpatient follow up or inpatient treatment.  After that statement he reported "I am going to raise hell and destroy something likely regardless of what happens."  Discussed that if he has any impulsive, aggressive behaviors to talk with the nursing staff, so they can help him calm down or get myself to talk with him, including offer medications.  He reported "meds do not help."  Discussed that if he is continuing to have this level of aggression acutely that he likely needs inpatient for crisis stabilization, and he agreed.  Reports that he was last hospitalized in 2020 and does not describe why.  Reports that he has had a therapist but was not helpful.  Reports since being in [facility] he has gotten terrible sleep from being "frustrated".  Throughout the interview he is insisting that he wants a new group home and informed patient that that is not something we can facilitate and our goal is to determine immediate next steps for his psychiatric care.  Patient also reports having suicidal thoughts with plan to stab himself.  He does not endorse any other symptoms of depression except for poor sleep and depressed mood.    Group home supervisor and staff, [phone]:   Spoke with Natacha and Bearl Limes who are the supervisors of the group home.  They report that he has a history of calling the cops on himself in order to avoid situations in his life and  because he enjoys the hospital.  They also report that he has a "infatuation with EMS" and has thrills from going to the emergency department.  They report that they feel he could be safe to return to group home and feel like that would benefit him more than inpatient.  I discussed my concerns that patient broke a television and was being aggressive on my assessment including threatening to act up and cause more harm to the facility or others, which I feel warrants inpatient treatment for crisis stabilization and likely psychiatric medications and is unsafe in this current state for discharge.  They reported understanding for this and reported that the television was not as serious of a break as they previously thought, but they also agree that he does have some aggressive behaviors that need support.  Discussed that he if he is not accepted to a facility in a day or 2 that we will safety plan and get him back to their group home.  They report no safety concerns to him coming back to the facility.  Although they feel returning back facility is ideal, they do agree with the inpatient hospitalization. They report that patient is his own guardian. They also clarified that patient is in a day program to target his maladaptive behaviors and that they are doing a lot of work with him to improve his current aggressive behaviors.   Spoke with the admission coordinator for [place], and she  reported there is no beds.  Discussed the social worker faxing out to facilities in the area.  Diagnosis:  Final diagnoses:  Aggressive behavior of adult  H/O autism spectrum disorder    Total Time spent with patient: 20 minutes  Past Psychiatric History: History of aggression, DMDD, ADHD combined, adjustment disorder, autism spectrum disorder, anxiety Past Medical History: Mild cognitive impairment, obesity Family History: Denies any family history Family Psychiatric  History: Denies any family history of psychiatric  conditions Social History: Reports that he lives at a group home but does not like it.  Reports he has been there for the last 4 years.  Reports that he has social supports there.  Additional Social History:    Pain Medications: SEE MAR Prescriptions: SEE MAR Over the Counter: SEE MAR History of alcohol / drug use?: No history of alcohol / drug abuse Longest period of sobriety (when/how long): NA Negative Consequences of Use:  (N/A) Withdrawal Symptoms:  (N/A)                    Sleep: Poor  Appetite:  Fair  Current Medications:  No current facility-administered medications for this encounter.   Current Outpatient Medications  Medication Sig Dispense Refill   atomoxetine  (STRATTERA ) 60 MG capsule Take 60 mg by mouth daily.     busPIRone  (BUSPAR ) 15 MG tablet Take 15 mg by mouth 3 (three) times daily.     cetirizine  (ZYRTEC ) 10 MG tablet Take 1 tablet (10 mg total) by mouth daily. Please make appointment for further refills. 30 tablet 11   chlorproMAZINE  (THORAZINE ) 25 MG tablet Take 3 tablets (75 mg total) by mouth at bedtime. (Patient taking differently: Take 25 mg by mouth 2 (two) times daily.) 30 tablet 0   docusate sodium  (COLACE) 100 MG capsule TAKE 1 CAPSULE BY MOUTH ONCE DAILY 30 capsule 9   fluticasone  (FLONASE ) 50 MCG/ACT nasal spray Place 2 sprays into both nostrils daily. 16 g 3   fluvoxaMINE  (LUVOX ) 100 MG tablet Take 100 mg by mouth 2 (two) times daily.     GNP VITAMIN D3 EXTRA STRENGTH 25 MCG (1000 UT) tablet TAKE 1 TABLET BY MOUTH EVERY DAY 30 tablet 9   hydrOXYzine  (ATARAX ) 50 MG tablet Take 100 mg by mouth every morning. Take an additional tablet three times daily as needed for anxiety, panic, agitation and/or sleep     melatonin 3 MG TABS tablet TAKE 1 TABLET BY MOUTH AT BEDTIME 30 tablet 10    Labs  Lab Results:  Admission on 11/29/2023, Discharged on 11/30/2023  Component Date Value Ref Range Status   WBC 11/29/2023 7.7  4.0 - 10.5 K/uL Final   RBC  11/29/2023 7.06 (H)  4.22 - 5.81 MIL/uL Final   Hemoglobin 11/29/2023 18.4 (H)  13.0 - 17.0 g/dL Final   HCT 16/03/9603 55.5 (H)  39.0 - 52.0 % Final   MCV 11/29/2023 78.6 (L)  80.0 - 100.0 fL Final   MCH 11/29/2023 26.1  26.0 - 34.0 pg Final   MCHC 11/29/2023 33.2  30.0 - 36.0 g/dL Final   RDW 54/02/8118 15.7 (H)  11.5 - 15.5 % Final   Platelets 11/29/2023 300  150 - 400 K/uL Final   nRBC 11/29/2023 0.0  0.0 - 0.2 % Final   Neutrophils Relative % 11/29/2023 57  % Final   Neutro Abs 11/29/2023 4.5  1.7 - 7.7 K/uL Final   Lymphocytes Relative 11/29/2023 29  % Final   Lymphs Abs 11/29/2023 2.2  0.7 - 4.0 K/uL Final   Monocytes Relative 11/29/2023 11  % Final   Monocytes Absolute 11/29/2023 0.8  0.1 - 1.0 K/uL Final   Eosinophils Relative 11/29/2023 1  % Final   Eosinophils Absolute 11/29/2023 0.1  0.0 - 0.5 K/uL Final   Basophils Relative 11/29/2023 1  % Final   Basophils Absolute 11/29/2023 0.1  0.0 - 0.1 K/uL Final   Immature Granulocytes 11/29/2023 1  % Final   Abs Immature Granulocytes 11/29/2023 0.04  0.00 - 0.07 K/uL Final   Performed at [facility], [address] [place], [place] [address]   Sodium 11/29/2023 139  135 - 145 mmol/L Final   Potassium 11/29/2023 4.3  3.5 - 5.1 mmol/L Final   Chloride 11/29/2023 102  98 - 111 mmol/L Final   CO2 11/29/2023 26  22 - 32 mmol/L Final   Glucose, Bld 11/29/2023 93  70 - 99 mg/dL Final   Glucose reference range applies only to samples taken after fasting for at least 8 hours.   BUN 11/29/2023 12  6 - 20 mg/dL Final   Creatinine, Ser 11/29/2023 1.18  0.61 - 1.24 mg/dL Final   Calcium 60/45/4098 10.1  8.9 - 10.3 mg/dL Final   Total Protein 11/91/4782 7.4  6.5 - 8.1 g/dL Final   Albumin 95/62/1308 4.3  3.5 - 5.0 g/dL Final   AST 65/78/4696 29  15 - 41 U/L Final   ALT 11/29/2023 59 (H)  0 - 44 U/L Final   Alkaline Phosphatase 11/29/2023 61  38 - 126 U/L Final   Total Bilirubin 11/29/2023 1.0  0.0 - 1.2 mg/dL Final   GFR, Estimated  11/29/2023 >60  >60 mL/min Final   Comment: (NOTE) Calculated using the CKD-EPI Creatinine Equation (2021)    Anion gap 11/29/2023 11  5 - 15 Final   Performed at [facility], [address], [place], [place] [address]   Hgb A1c MFr Bld 11/29/2023 5.5  4.8 - 5.6 % Final   Comment: (NOTE) Diagnosis of Diabetes The following HbA1c ranges recommended by the American Diabetes Association (ADA) may be used as an aid in the diagnosis of diabetes mellitus.  Hemoglobin             Suggested A1C NGSP%              Diagnosis  <5.7                   Non Diabetic  5.7-6.4                Pre-Diabetic  >6.4                   Diabetic  <7.0                   Glycemic control for                       adults with diabetes.     Mean Plasma Glucose 11/29/2023 111.15  mg/dL Final   Performed at [facility], [address] [place], [place] [address]   Alcohol, Ethyl (B) 11/29/2023 <15  <15 mg/dL Final   Comment: (NOTE) For medical purposes only. Performed at [facility], [address] [place], [place] [address]    Cholesterol 11/29/2023 170  0 - 200 mg/dL Final   Triglycerides 72/53/6644 161 (H)  <150 mg/dL Final   HDL 03/47/4259  28 (L)  >40 mg/dL Final   Total CHOL/HDL Ratio 11/29/2023 6.1  RATIO Final   VLDL 11/29/2023 32  0 - 40 mg/dL Final   LDL Cholesterol 11/29/2023 110 (H)  0 - 99 mg/dL Final   Comment:        Total Cholesterol/HDL:CHD Risk Coronary Heart Disease Risk Table                     Men   Women  1/2 Average Risk   3.4   3.3  Average Risk       5.0   4.4  2 X Average Risk   9.6   7.1  3 X Average Risk  23.4   11.0        Use the calculated Patient Ratio above and the CHD Risk Table to determine the patient's CHD Risk.        ATP III CLASSIFICATION (LDL):  <100     mg/dL   Optimal  161-096  mg/dL   Near or Above                    Optimal  130-159  mg/dL   Borderline  045-409  mg/dL   High  >811     mg/dL   Very High Performed at [facility], [address], [place], [place] [address]    POC Amphetamine UR 11/29/2023 None Detected  NONE DETECTED (Cut Off Level 1000 ng/mL) Final   POC Secobarbital (BAR) 11/29/2023 None Detected  NONE DETECTED (Cut Off Level 300 ng/mL) Final   POC Buprenorphine (BUP) 11/29/2023 None Detected  NONE DETECTED (Cut Off Level 10 ng/mL) Final   POC Oxazepam (BZO) 11/29/2023 Positive (A)  NONE DETECTED (Cut Off Level 300 ng/mL) Final   POC Cocaine UR 11/29/2023 None Detected  NONE DETECTED (Cut Off Level 300 ng/mL) Final   POC Methamphetamine UR 11/29/2023 None Detected  NONE DETECTED (Cut Off Level 1000 ng/mL) Final   POC Morphine 11/29/2023 None Detected  NONE DETECTED (Cut Off Level 300 ng/mL) Final   POC Methadone UR 11/29/2023 None Detected  NONE DETECTED (Cut Off Level 300 ng/mL) Final   POC Oxycodone UR 11/29/2023 None Detected  NONE DETECTED (Cut Off Level 100 ng/mL) Final   POC Marijuana UR 11/29/2023 None Detected  NONE DETECTED (Cut Off Level 50 ng/mL) Final  Admission on 11/15/2023, Discharged on 11/16/2023  Component Date Value Ref Range Status   Sodium 11/15/2023 137  135 - 145 mmol/L Final   Potassium 11/15/2023 3.9  3.5 - 5.1 mmol/L Final   Chloride 11/15/2023 104  98 - 111 mmol/L Final   CO2 11/15/2023 21 (L)  22 - 32 mmol/L Final   Glucose, Bld 11/15/2023 108 (H)  70 - 99 mg/dL Final   Glucose reference range applies only to samples taken after fasting for at least 8 hours.   BUN 11/15/2023 11  6 - 20 mg/dL Final   Creatinine, Ser 11/15/2023 0.93  0.61 - 1.24 mg/dL Final   Calcium 29/56/2130 9.6  8.9 - 10.3 mg/dL Final   Total Protein 86/57/8469 7.8  6.5 - 8.1 g/dL Final   Albumin 62/95/2841 4.3  3.5 - 5.0 g/dL Final   AST 32/44/0102 40  15 - 41 U/L Final   ALT 11/15/2023 84 (H)  0 - 44 U/L Final   Alkaline Phosphatase 11/15/2023 69  38 - 126 U/L Final   Total Bilirubin 11/15/2023 1.3 (H)  0.0 -  1.2 mg/dL Final   GFR, Estimated 11/15/2023 >60  >60 mL/min Final    Comment: (NOTE) Calculated using the CKD-EPI Creatinine Equation (2021)    Anion gap 11/15/2023 12  5 - 15 Final   Performed at [facility], [address], [place], [place] [address]   Alcohol, Ethyl (B) 11/15/2023 <15  <15 mg/dL Final   Comment: Please note change in reference range. (NOTE) For medical purposes only. Performed at [facility], [address] [place], [place] [address]    WBC 11/15/2023 7.9  4.0 - 10.5 K/uL Final   RBC 11/15/2023 7.20 (H)  4.22 - 5.81 MIL/uL Final   Hemoglobin 11/15/2023 18.5 (H)  13.0 - 17.0 g/dL Final   HCT 56/21/3086 57.0 (H)  39.0 - 52.0 % Final   MCV 11/15/2023 79.2 (L)  80.0 - 100.0 fL Final   MCH 11/15/2023 25.7 (L)  26.0 - 34.0 pg Final   MCHC 11/15/2023 32.5  30.0 - 36.0 g/dL Final   RDW 57/84/6962 15.2  11.5 - 15.5 % Final   Platelets 11/15/2023 306  150 - 400 K/uL Final   nRBC 11/15/2023 0.0  0.0 - 0.2 % Final   Performed at [facility], [address] [place], [place] [address]   Opiates 11/16/2023 NONE DETECTED  NONE DETECTED Final   Cocaine 11/16/2023 NONE DETECTED  NONE DETECTED Final   Benzodiazepines 11/16/2023 NONE DETECTED  NONE DETECTED Final   Amphetamines 11/16/2023 NONE DETECTED  NONE DETECTED Final   Tetrahydrocannabinol 11/16/2023 NONE DETECTED  NONE DETECTED Final   Barbiturates 11/16/2023 NONE DETECTED  NONE DETECTED Final   Comment: (NOTE) DRUG SCREEN FOR MEDICAL PURPOSES ONLY.  IF CONFIRMATION IS NEEDED FOR ANY PURPOSE, NOTIFY LAB WITHIN 5 DAYS.  LOWEST DETECTABLE LIMITS FOR URINE DRUG SCREEN Drug Class                     Cutoff (ng/mL) Amphetamine and metabolites    1000 Barbiturate and metabolites    200 Benzodiazepine                 200 Opiates and metabolites        300 Cocaine and metabolites        300 THC                            50 Performed at [facility], [address], [place], [place] [address]   Admission on  06/15/2023, Discharged on 06/16/2023  Component Date Value Ref Range Status   Sodium 06/15/2023 138  135 - 145 mmol/L Final   Potassium 06/15/2023 3.7  3.5 - 5.1 mmol/L Final   Chloride 06/15/2023 103  98 - 111 mmol/L Final   CO2 06/15/2023 25  22 - 32 mmol/L Final   Glucose, Bld 06/15/2023 79  70 - 99 mg/dL Final   Glucose reference range applies only to samples taken after fasting for at least 8 hours.   BUN 06/15/2023 15  6 - 20 mg/dL Final   Creatinine, Ser 06/15/2023 1.06  0.61 - 1.24 mg/dL Final   Calcium 07/02/7251 9.3  8.9 - 10.3 mg/dL Final   Total Protein 66/44/0347 7.8  6.5 - 8.1 g/dL Final   Albumin 42/59/5638 4.4  3.5 - 5.0 g/dL Final   AST 75/64/3329 35  15 - 41 U/L Final   ALT 06/15/2023 69 (H)  0 - 44 U/L  Final   Alkaline Phosphatase 06/15/2023 60  38 - 126 U/L Final   Total Bilirubin 06/15/2023 1.1  <1.2 mg/dL Final   GFR, Estimated 06/15/2023 >60  >60 mL/min Final   Comment: (NOTE) Calculated using the CKD-EPI Creatinine Equation (2021)    Anion gap 06/15/2023 10  5 - 15 Final   Performed at [facility], [address], [place], [place] [address]   Alcohol, Ethyl (B) 06/15/2023 <10  <10 mg/dL Final   Comment: (NOTE) Lowest detectable limit for serum alcohol is 10 mg/dL.  For medical purposes only. Performed at [facility], [address], [place], [place] [address]    Salicylate Lvl 06/15/2023 <7.0 (L)  7.0 - 30.0 mg/dL Final   Performed at [facility], [address] [place], [place] [address]   Acetaminophen  (Tylenol ), Serum 06/15/2023 <10 (L)  10 - 30 ug/mL Final   Comment: (NOTE) Therapeutic concentrations vary significantly. A range of 10-30 ug/mL  may be an effective concentration for many patients. However, some  are best treated at concentrations outside of this range. Acetaminophen  concentrations >150 ug/mL at 4 hours after ingestion  and >50 ug/mL at 12 hours after ingestion are often  associated with  toxic reactions.  Performed at [facility], [address], [place], [place] [address]    WBC 06/15/2023 8.8  4.0 - 10.5 K/uL Final   RBC 06/15/2023 6.47 (H)  4.22 - 5.81 MIL/uL Final   Hemoglobin 06/15/2023 17.8 (H)  13.0 - 17.0 g/dL Final   HCT 21/30/8657 52.2 (H)  39.0 - 52.0 % Final   MCV 06/15/2023 80.7  80.0 - 100.0 fL Final   MCH 06/15/2023 27.5  26.0 - 34.0 pg Final   MCHC 06/15/2023 34.1  30.0 - 36.0 g/dL Final   RDW 84/69/6295 13.9  11.5 - 15.5 % Final   Platelets 06/15/2023 260  150 - 400 K/uL Final   nRBC 06/15/2023 0.0  0.0 - 0.2 % Final   Performed at [facility], [address], [place], [place] [address]    Blood Alcohol level:  Lab Results  Component Value Date   [facility] <15 11/29/2023   ETH <15 11/15/2023    Metabolic Disorder Labs: Lab Results  Component Value Date   HGBA1C 5.5 11/29/2023   MPG 111.15 11/29/2023   No results found for: "PROLACTIN" Lab Results  Component Value Date   CHOL 170 11/29/2023   TRIG 161 (H) 11/29/2023   HDL 28 (L) 11/29/2023   CHOLHDL 6.1 11/29/2023   VLDL 32 11/29/2023   LDLCALC 110 (H) 11/29/2023   LDLCALC 104 (H) 12/02/2022    Therapeutic Lab Levels: No results found for: "LITHIUM" No results found for: "VALPROATE" No results found for: "CBMZ"  Physical Findings   PHQ2-9    Flowsheet Row ED from 12/04/2022 in [facility] Office Visit from 12/02/2022 in [facility] [place]. [facility] Office Visit from 07/13/2020 in [facility] - [facility] Of [place]. [facility] Office Visit from 06/17/2019 in [facility] Of [place]. [facility]  PHQ-2 Total Score 0 0 0 0  PHQ-9 Total Score 1 1 0 --      Flowsheet Row ED from 11/29/2023 in [facility] ED from 11/15/2023 in [facility] at [facility]  UC  from 07/09/2023 in [facility] at [facility])  C-SSRS RISK CATEGORY High Risk Low Risk No Risk        Musculoskeletal  Strength & Muscle Tone: within normal limits Gait & Station: normal Patient leans: N/A  Psychiatric Specialty Exam  Presentation  General Appearance:  Casual  Eye Contact: Fair  Speech: Normal Rate  Speech Volume: Normal  Handedness: Right   Mood and Affect  Mood: Irritable  Affect: Congruent   Thought Process  Thought Processes: Coherent; Linear  Descriptions of Associations:Intact  Orientation:Full (Time, Place and Person)  Thought Content:Logical  Diagnosis of Schizophrenia or Schizoaffective disorder in past: No    Hallucinations:Hallucinations: None  Ideas of Reference:None  Suicidal Thoughts:Suicidal Thoughts: No  Homicidal Thoughts:Homicidal Thoughts: No   Sensorium  Memory: Immediate Good; Recent Good; Remote Good  Judgment: Poor  Insight: Poor   Executive Functions  Concentration: Fair  Attention Span: Fair  Recall: [employer] of Knowledge: Fair  Language: Fair   Psychomotor Activity  Psychomotor Activity: Psychomotor Activity: Normal   Assets  Assets: Housing   Sleep  Sleep: Sleep: Poor (i slept terrible and i feel agitated)   No data recorded   Physical Exam  Physical Exam Vitals and nursing note reviewed.  Constitutional:      General: He is not in acute distress.    Appearance: He is well-developed.  HENT:     Head: Normocephalic and atraumatic.  Pulmonary:     Effort: Pulmonary effort is normal. No respiratory distress.  Musculoskeletal:        General: No swelling.  Neurological:     General: No focal deficit present.     Mental Status: He is alert.    Review of Systems  Constitutional:  Negative for fever.  Cardiovascular:  Negative for chest pain and palpitations.  Gastrointestinal:  Negative for constipation, diarrhea, nausea and  vomiting.  Neurological:  Negative for dizziness, weakness and headaches.   Blood pressure 136/87, pulse 88, temperature 98 F (36.7 C), resp. rate 18, SpO2 97%. There is no height or weight on file to calculate BMI.  Treatment Plan Summary: On interview today patient had significant agitation and made threats about causing harm to property or others in the facility due to his frustration.  Given that he had already broken TV, feel that he is appropriate for inpatient psychiatry for crisis stabilization, safety monitoring, and likely intensive medication intervention.  Upon talking with group home there is some concern that he is rewarded by getting inpatient psychiatric admissions but discussed that he is appropriate for inpatient this time and that he is not accepted that we could consider safety planning tomorrow or the next day given that group home is not concerned about safety and report that he is in a day program which is managing his maladaptive behaviors.  Daily contact with patient to assess and evaluate symptoms and progress in treatment and Plan inpatient psych as voluntary admission for crisis stabilization and intensive medication adjustment if indicated.   Verdell Given, MD 12/01/2023 6:40 AM

## 2023-11-30 NOTE — Progress Notes (Addendum)
 Pt has been accepted to [facility] TODAY  06/02/20025, pending (items) faxed to [phone]. Bed assignment: Main campus  Pt meets inpatient criteria per: Nickola Baron NP  Attending Physician will be Lavona Pounds, MD  Report can be called to: [phone] (this is a pager, please leave call-back number when giving report)  Pt can arrive ASAP  Care Team Notified:  Ryta Sylvester Evert RN, Verdell Given MD  [place] Maddex Garlitz LCSW-A   11/30/2023 2:34 PM

## 2023-11-30 NOTE — Progress Notes (Signed)
 Pt is awake, alert and oriented X3. Pt did not voice any complaints of pain or discomfort. No signs of acute distress noted. Pt denies current SI/HI/AVH, plan or intent. Staff will monitor for pt's safety.

## 2023-11-30 NOTE — ED Notes (Signed)
 Pt observed/assessed in recliner, awake, RR even and unlabored, appearing in no noted distress. Environmental check complete, will continue to monitor for safety

## 2023-11-30 NOTE — Progress Notes (Signed)
 Report called to Scarlette Currier, RN [facility].

## 2023-11-30 NOTE — Progress Notes (Signed)
 Call placed to [facility] [phone]) and she said pt is able to return to group home and also that pt is his own legal guardian. This information was given to [facility] intake.

## 2023-11-30 NOTE — Progress Notes (Signed)
 LCSW Progress Note  [id number]   Ray Carter  11/30/2023  11:10 AM  Description:   Inpatient Psychiatric Referral  Patient was recommended inpatient per Nickola Baron NP. There are no available beds at [facility], per [facility] Kathryn Parish RN. Patient was referred to the following out of network facilities:   Destination  Service Provider Address Phone Fax  [facility] [address] [place] [address] [phone]  [facility] [address] [place], [place] [address] [phone]  [facility] [address] [place] [address] [phone]  [facility] [address] [place] [address] [phone]  [facility] [address], [place] [address] [phone]  [facility] [address] [place] [address] [phone]  [facility] EFAX [address] [place], [place] [phone]  [facility] [address] [place] [address] [phone]  [facility] [address] [place] [address] [phone]      Situation ongoing, CSW to continue following and update chart as more information becomes available.      [name], MSW, LCSW  11/30/2023 11:10 AM

## 2023-11-30 NOTE — ED Provider Notes (Addendum)
 FBC/OBS ASAP Discharge Summary  Date and Time: 11/30/2023 2:46 PM  Name: Ray Carter  MRN:  [id number]   Discharge Diagnoses:  Final diagnoses:  Aggressive behavior of adult  H/O autism spectrum disorder    Ray Carter is a 23 y.o. male with past history of autism spectrum disorder and  aggression who presents with suicidal thoughts and aggressive behavior including breaking a TV at his group home.   Subjective:   During initial part of interview patient is making inappropriate comments:  He reports that he is going to "act up" if he is not getting mental health care, to which responded that it is our goal is to help him and advocate for what is best for him whether outpatient follow up or inpatient treatment.  After that statement he reported "I am going to raise hell and destroy something likely regardless of what happens."  Discussed that if he has any impulsive, aggressive behaviors to talk with the nursing staff, so they can help him calm down or get myself to talk with him, including offer medications.  He reported "meds do not help."  Discussed that if he is continuing to have this level of aggression acutely that he likely needs inpatient for crisis stabilization, and he agreed.  Reports that he was last hospitalized in 2020 and does not describe why.  Reports that he has had a therapist but was not helpful.  Reports since being in [facility] he has gotten terrible sleep from being "frustrated".  Throughout the interview he is insisting that he wants a new group home and informed patient that that is not something we can facilitate and our goal is to determine immediate next steps for his psychiatric care.  Patient also reports having suicidal thoughts with plan to stab himself.  He does not endorse any other symptoms of depression except for poor sleep and depressed mood.     Group home supervisor and staff, [phone]:   Spoke with Natacha and Bearl Limes who are the supervisors of the group home.   They report that he has a history of calling the cops on himself in order to avoid situations in his life and because he enjoys the hospital.  They also report that he has a "infatuation with EMS" and has thrills from going to the emergency department.  They report that they feel he could be safe to return to group home and feel like that would benefit him more than inpatient.  I discussed my concerns that patient broke a television and was being aggressive on my assessment including threatening to act up and cause more harm to the facility or others, which I feel warrants inpatient treatment for crisis stabilization and likely psychiatric medications and is unsafe in this current state for discharge.  They reported understanding for this and reported that the television was not as serious of a break as they previously thought, but they also agree that he does have some aggressive behaviors that need support.  Discussed that he if he is not accepted to a facility in a day or 2 that we will safety plan and get him back to their group home.  They report no safety concerns to him coming back to the facility.  Although they feel returning back facility is ideal, they do agree with the inpatient hospitalization. They report that patient is his own guardian. They also clarified that patient is in a day program to target his maladaptive behaviors and that they are doing a  lot of work with him to improve his current aggressive behaviors.    Spoke with the admission coordinator for [facility], and she reported there is no beds.  Discussed the social worker faxing out to facilities in the area. Patient was accepted to [facility].   Stay Summary:  The patient was admitted for suicidal thoughts which appeared to be attention seeking (euthymic affect, chronic SI for attention per group home, denying other depression symptoms or episode triggers) but patient was also engaging in acutely worse aggressive behavior including  breaking television and had displayed this behavior on interview the morning when he told the interviewer that he would "act up" and "I am going to raise hell" without any trigger/prompting.  This raised concern for continued aggressive behavior that would benefit from crisis stabilization and potential intensive medication intervention. Group home okay with him coming home but were also okay with patient going to inpatient for several days given acute worsening of aggression. Group home says pt is his own legal guardian and can come back to their facility after psychiatric hospitalization.   Total Time spent with patient: 30 minutes  Past Psychiatric History: History of aggression, DMDD, ADHD combined, adjustment disorder, autism spectrum disorder, anxiety Past Medical History: Mild cognitive impairment, obesity Family History: Denies any family history Family Psychiatric  History: Denies any family history of psychiatric conditions Social History: Reports that he lives at a group home but does not like it.  Reports he has been there for the last 4 years.  Reports that he has social supports there. Tobacco Cessation:  N/A, patient does not currently use tobacco products  Current Medications:  Current Facility-Administered Medications  Medication Dose Route Frequency Provider Last Rate Last Admin   acetaminophen  (TYLENOL ) tablet 650 mg  650 mg Oral Q6H PRN White, Patrice L, NP       haloperidol (HALDOL) tablet 5 mg  5 mg Oral TID PRN White, Patrice L, NP       And   diphenhydrAMINE  (BENADRYL ) capsule 50 mg  50 mg Oral TID PRN White, Patrice L, NP       haloperidol lactate (HALDOL) injection 5 mg  5 mg Intramuscular TID PRN White, Patrice L, NP       And   diphenhydrAMINE  (BENADRYL ) injection 50 mg  50 mg Intramuscular TID PRN White, Patrice L, NP       And   LORazepam  (ATIVAN ) injection 2 mg  2 mg Intramuscular TID PRN White, Patrice L, NP       haloperidol lactate (HALDOL) injection 10 mg  10  mg Intramuscular TID PRN White, Patrice L, NP       And   diphenhydrAMINE  (BENADRYL ) injection 50 mg  50 mg Intramuscular TID PRN White, Patrice L, NP       And   LORazepam  (ATIVAN ) injection 2 mg  2 mg Intramuscular TID PRN White, Patrice L, NP       hydrOXYzine  (ATARAX ) tablet 25 mg  25 mg Oral TID PRN White, Patrice L, NP   25 mg at 11/29/23 2125   traZODone  (DESYREL ) tablet 50 mg  50 mg Oral QHS PRN White, Patrice L, NP   50 mg at 11/29/23 2125   Current Outpatient Medications  Medication Sig Dispense Refill   atomoxetine  (STRATTERA ) 60 MG capsule Take 60 mg by mouth daily.     busPIRone  (BUSPAR ) 15 MG tablet Take 15 mg by mouth 3 (three) times daily.     cetirizine  (ZYRTEC ) 10  MG tablet Take 1 tablet (10 mg total) by mouth daily. Please make appointment for further refills. 30 tablet 11   chlorproMAZINE  (THORAZINE ) 25 MG tablet Take 3 tablets (75 mg total) by mouth at bedtime. (Patient taking differently: Take 25 mg by mouth 2 (two) times daily.) 30 tablet 0   docusate sodium  (COLACE) 100 MG capsule TAKE 1 CAPSULE BY MOUTH ONCE DAILY 30 capsule 9   fluticasone  (FLONASE ) 50 MCG/ACT nasal spray Place 2 sprays into both nostrils daily. 16 g 3   fluvoxaMINE  (LUVOX ) 100 MG tablet Take 100 mg by mouth 2 (two) times daily.     GNP VITAMIN D3 EXTRA STRENGTH 25 MCG (1000 UT) tablet TAKE 1 TABLET BY MOUTH EVERY DAY 30 tablet 9   hydrOXYzine  (ATARAX ) 50 MG tablet Take 100 mg by mouth every morning. Take an additional tablet three times daily as needed for anxiety, panic, agitation and/or sleep     melatonin 3 MG TABS tablet TAKE 1 TABLET BY MOUTH AT BEDTIME 30 tablet 10    PTA Medications:  Facility Ordered Medications  Medication   haloperidol (HALDOL) tablet 5 mg   And   diphenhydrAMINE  (BENADRYL ) capsule 50 mg   haloperidol lactate (HALDOL) injection 5 mg   And   diphenhydrAMINE  (BENADRYL ) injection 50 mg   And   LORazepam  (ATIVAN ) injection 2 mg   haloperidol lactate (HALDOL) injection  10 mg   And   diphenhydrAMINE  (BENADRYL ) injection 50 mg   And   LORazepam  (ATIVAN ) injection 2 mg   hydrOXYzine  (ATARAX ) tablet 25 mg   traZODone  (DESYREL ) tablet 50 mg   acetaminophen  (TYLENOL ) tablet 650 mg   PTA Medications  Medication Sig   fluvoxaMINE  (LUVOX ) 100 MG tablet Take 100 mg by mouth 2 (two) times daily.   busPIRone  (BUSPAR ) 15 MG tablet Take 15 mg by mouth 3 (three) times daily.   chlorproMAZINE  (THORAZINE ) 25 MG tablet Take 3 tablets (75 mg total) by mouth at bedtime. (Patient taking differently: Take 25 mg by mouth 2 (two) times daily.)   atomoxetine  (STRATTERA ) 60 MG capsule Take 60 mg by mouth daily.   cetirizine  (ZYRTEC ) 10 MG tablet Take 1 tablet (10 mg total) by mouth daily. Please make appointment for further refills.   hydrOXYzine  (ATARAX ) 50 MG tablet Take 100 mg by mouth every morning. Take an additional tablet three times daily as needed for anxiety, panic, agitation and/or sleep   melatonin 3 MG TABS tablet TAKE 1 TABLET BY MOUTH AT BEDTIME   docusate sodium  (COLACE) 100 MG capsule TAKE 1 CAPSULE BY MOUTH ONCE DAILY   GNP VITAMIN D3 EXTRA STRENGTH 25 MCG (1000 UT) tablet TAKE 1 TABLET BY MOUTH EVERY DAY   fluticasone  (FLONASE ) 50 MCG/ACT nasal spray Place 2 sprays into both nostrils daily.       12/04/2022    6:06 PM 12/02/2022   10:22 AM 07/13/2020    8:56 AM  Depression screen PHQ 2/9  Decreased Interest 0 0 0  Down, Depressed, Hopeless 0 0 0  PHQ - 2 Score 0 0 0  Altered sleeping 0 0 0  Tired, decreased energy 0 0 0  Change in appetite 0 1 0  Feeling bad or failure about yourself  0 0 0  Trouble concentrating 1 0 0  Moving slowly or fidgety/restless 0 0 0  Suicidal thoughts 0 0 0  PHQ-9 Score 1 1 0  Difficult doing work/chores  Somewhat difficult     Flowsheet Row ED from 11/29/2023 in  [facility] ED from 11/15/2023 in [facility] at [facility] UC from 07/09/2023 in [facility] at  [facility])  C-SSRS RISK CATEGORY High Risk Low Risk No Risk       Musculoskeletal  Strength & Muscle Tone: within normal limits Gait & Station: normal Patient leans: N/A  Psychiatric Specialty Exam  Presentation  General Appearance:  Casual  Eye Contact: Fair  Speech: Normal Rate  Speech Volume: Normal  Handedness: Right   Mood and Affect  Mood: Irritable  Affect: Congruent   Thought Process  Thought Processes: Coherent; Linear  Descriptions of Associations:Intact  Orientation:Full (Time, Place and Person)  Thought Content:Logical  Diagnosis of Schizophrenia or Schizoaffective disorder in past: No    Hallucinations:Hallucinations: None  Ideas of Reference:None  Suicidal Thoughts:Suicidal Thoughts: No  Homicidal Thoughts:Homicidal Thoughts: No   Sensorium  Memory: Immediate Good; Recent Good; Remote Good  Judgment: Poor  Insight: Poor   Executive Functions  Concentration: Fair  Attention Span: Fair  Recall: Fair  Fund of Knowledge: Fair  Language: Fair   Psychomotor Activity  Psychomotor Activity: Psychomotor Activity: Normal   Assets  Assets: Housing   Sleep  Sleep: Sleep: Poor (i slept terrible and i feel agitated) Number of Hours of Sleep: 10   Nutritional Assessment (For OBS and FBC admissions only) Has the patient had a weight loss or gain of 10 pounds or more in the last 3 months?: No Has the patient had a decrease in food intake/or appetite?: No Does the patient have dental problems?: No Does the patient have eating habits or behaviors that may be indicators of an eating disorder including binging or inducing vomiting?: No Has the patient recently lost weight without trying?: 0 Has the patient been eating poorly because of a decreased appetite?: 0 Malnutrition Screening Tool Score: 0    Physical Exam  Physical Exam Vitals and nursing note reviewed.  Constitutional:      General:  He is not in acute distress.    Appearance: He is well-developed.  HENT:     Head: Normocephalic and atraumatic.  Pulmonary:     Effort: Pulmonary effort is normal. No respiratory distress.  Musculoskeletal:        General: No swelling.  Neurological:     General: No focal deficit present.     Mental Status: He is alert.    Review of Systems  Constitutional:  Negative for fever.  Cardiovascular:  Negative for chest pain and palpitations.  Gastrointestinal:  Negative for constipation, diarrhea, nausea and vomiting.  Neurological:  Negative for dizziness, weakness and headaches.   Blood pressure 136/87, pulse 88, temperature 98 F (36.7 C), temperature source Oral, resp. rate 18, SpO2 97%. There is no height or weight on file to calculate BMI.  Demographic Factors:  Male, Adolescent or young adult, Caucasian, and Low socioeconomic status  Loss Factors: Decrease in vocational status  Historical Factors: Impulsivity  Risk Reduction Factors:   Lives in monitored group home setting and attending day program to target his aggressive behaviors.   Continued Clinical Symptoms:  More than one psychiatric diagnosis Previous Psychiatric Diagnoses and Treatments  Cognitive Features That Contribute To Risk:  Loss of executive function    Suicide Risk:  Mild:  Suicidal ideation of limited frequency, intensity, duration, and specificity.  There are no identifiable plans, no associated intent, mild dysphoria and related symptoms, good self-control (both objective and subjective assessment), few other risk factors, and  identifiable protective factors, including available and accessible social support. Patient has history of reporting suicidal thoughts were attention which appears to be same currently.   Plan Of Care/Follow-up recommendations:  Activity: as tolerated  Diet: heart healthy  Other: -Follow-up with your outpatient psychiatric provider -instructions on appointment date,  time, and address (location) are provided to you in discharge paperwork.  -Take your psychiatric medications as prescribed at discharge - instructions are provided to you in the discharge paperwork  -Follow-up with outpatient primary care doctor and other specialists -for management of chronic medical disease, including: none  -Testing: Follow-up with outpatient provider for abnormal lab results: none  -Recommend abstinence from alcohol, tobacco, and other illicit drug use at discharge.   -If your psychiatric symptoms recur, worsen, or if you have side effects to your psychiatric medications, call your outpatient psychiatric provider, 911, [phone] or go to the nearest emergency department.  -If suicidal thoughts recur, call your outpatient psychiatric provider, 911, [phone] or go to the nearest emergency department.  Disposition: [facility] - accepted 6/2   Verdell Given, MD 11/30/2023, 2:46 PM

## 2023-12-03 ENCOUNTER — Telehealth: Payer: Self-pay

## 2023-12-03 NOTE — Telephone Encounter (Signed)
 Reviewed FL2 form.  Placed form in PCP's box to be completed.  Christ Courier, CMA

## 2023-12-03 NOTE — Telephone Encounter (Signed)
 [employer], Caregiver dropped off FL2 form to be filled out. Placed in [employer].

## 2023-12-21 NOTE — Progress Notes (Unsigned)
    SUBJECTIVE:   Chief compliant/HPI: annual examination  Ray Carter is a 23 y.o. who presents today for an annual exam.   Reviewed and updated history. ADHD, anxiety, depression (resides in group home for the past 4 years)   OBJECTIVE:  There were no vitals taken for this visit.  ***  ASSESSMENT/PLAN:   Assessment & Plan  Annual Examination  See AVS for age appropriate recommendations  PHQ score ***, reviewed and discussed.  Blood pressure reviewed and at goal. ***   Advanced directive not discussed.   Considered the following items based upon USPSTF recommendations: HIV testing: not indicated Hepatitis C: not indicated Hepatitis B: not indicated Syphilis if at high risk: {not indicated GC/CTnot indicated Lipid panel (nonfasting or fasting) discussed based upon AHA recommendations and not ordered.  Consider repeat every 4-6 years.  Reviewed risk factors for latent tuberculosis and not indicated Immunizations ***   Follow up in 1 year or sooner if indicated.  MyChart Activation: {MYCHARTLIST:32522}  Kieth Johnson, DO [facility]

## 2023-12-22 ENCOUNTER — Ambulatory Visit: Payer: MEDICAID | Admitting: Student

## 2023-12-22 ENCOUNTER — Encounter: Payer: Self-pay | Admitting: Student

## 2023-12-22 VITALS — BP 114/81 | HR 86 | Ht 75.0 in | Wt 244.6 lb

## 2023-12-22 DIAGNOSIS — Z0000 Encounter for general adult medical examination without abnormal findings: Secondary | ICD-10-CM | POA: Diagnosis not present

## 2023-12-22 DIAGNOSIS — Z23 Encounter for immunization: Secondary | ICD-10-CM

## 2023-12-22 DIAGNOSIS — M21611 Bunion of right foot: Secondary | ICD-10-CM | POA: Diagnosis not present

## 2023-12-22 MED ORDER — FLUTICASONE PROPIONATE 50 MCG/ACT NA SUSP: 2.0000 | Freq: Every day | NASAL | 3 refills | Status: DC | PRN

## 2023-12-22 NOTE — Patient Instructions (Addendum)
 I have referred you to podiatry for your bunion.  I we will fill out the forms for your physical.  I recommend getting set up with MyChart.  You may talk with our records department to obtain records for your visits.  Please wear sunscreen.  Things to do to Keep yourself Healthy  - Exercise at least 30-45 minutes a day, 3-4 days a week. >150 min of moderate intensity per week is advised. - Eat a low-fat diet with lots of fruits and vegetables, up to 7-9 servings per day. - Seatbelts can save your life. Wear them always. - Smoke detectors on every level of your home, check batteries every year. - Eye Doctor - have an eye exam every 1-2 years. - Safe sex - if you may be exposed to STDs, use a condom. - Alcohol If you drink, do it moderately, less than 2 drinks per day. - Health Care Power of Attorney.  Choose someone to speak for you if you are not able. - Depression is common in our stressful world.If you're feeling down or losing interest in things you normally enjoy, please come in for a visit.

## 2023-12-22 NOTE — Telephone Encounter (Signed)
 Completed FL2 form and given to patient's caregiver.

## 2023-12-22 NOTE — Assessment & Plan Note (Addendum)
 Medications reviewed, completed FL 2 form and given to caretaker.  PHQ score 0, reviewed and discussed.  Blood pressure reviewed and at goal.   Advanced directive not discussed.   Considered the following items based upon USPSTF recommendations: HIV testing: not indicated Hepatitis C: not indicated Hepatitis B: not indicated Syphilis if at high risk: {not indicated GC/CTnot indicated Lipid panel (nonfasting or fasting) discussed based upon AHA recommendations and not ordered.  Consider repeat every 4-6 years.  Reviewed risk factors for latent tuberculosis and not indicated Immunizations: Meningococcal and Tdap

## 2024-01-26 ENCOUNTER — Other Ambulatory Visit: Payer: Self-pay

## 2024-01-26 NOTE — Telephone Encounter (Signed)
 Received call from patients caregiver requesting refills on medications.   Advised previous PCP sent in one years worth of prescriptions in February 2025.  I called [facility] for more information.   [facility] is no longer a medicaid provider, and therefore these prescriptions can no longer be filled.   Advised will forward to his new PCP.

## 2024-01-27 MED ORDER — DOCUSATE SODIUM 100 MG PO CAPS: 100.0000 mg | ORAL_CAPSULE | Freq: Every day | ORAL | 11 refills | Status: DC

## 2024-01-27 MED ORDER — FLUTICASONE PROPIONATE 50 MCG/ACT NA SUSP: 2.0000 | Freq: Every day | NASAL | 5 refills | Status: DC | PRN

## 2024-01-27 MED ORDER — GNP VITAMIN D3 EXTRA STRENGTH 25 MCG (1000 UT) PO TABS: 1000.0000 [IU] | ORAL_TABLET | Freq: Every day | ORAL | 11 refills | Status: DC

## 2024-01-27 MED ORDER — MELATONIN 3 MG PO TABS: 3.0000 mg | ORAL_TABLET | Freq: Every day | ORAL | 11 refills | Status: DC

## 2024-04-06 ENCOUNTER — Ambulatory Visit
Admission: EM | Admit: 2024-04-06 | Discharge: 2024-04-06 | Disposition: A | Discharge status: Home / Self Care | Payer: MEDICAID

## 2024-04-06 ENCOUNTER — Encounter: Payer: Self-pay | Admitting: Emergency Medicine

## 2024-04-06 DIAGNOSIS — W57XXXA Bitten or stung by nonvenomous insect and other nonvenomous arthropods, initial encounter: Secondary | ICD-10-CM

## 2024-04-06 DIAGNOSIS — B354 Tinea corporis: Secondary | ICD-10-CM | POA: Diagnosis not present

## 2024-04-06 MED ORDER — CLOTRIMAZOLE 1 % EX CREA: TOPICAL_CREAM | CUTANEOUS | 0 refills | Status: DC

## 2024-04-06 NOTE — ED Provider Notes (Signed)
 [facility]    CSN: [id number] Arrival date & time: 04/06/24  1729      History   Chief Complaint Chief Complaint  Patient presents with   Possible Ringworm    Insect Bite    HPI Ray Carter is a 23 y.o. male.   Discussed the use of AI scribe software for clinical note transcription with the patient, who gave verbal consent to proceed.   The patient presents with ringworm for 4 days and a bug bite on the arm for 2 days. The ringworm is causing significant itching. The patient reports recent exposure to chickens while being outside. The bug bite itches when touched. Additionally, there is another wound that has been present for 2 weeks. In total, the patient has multiple wounds located on the left wrist, right forearm, left upper arm, right elbow, and left elbow. The patient has not tried any treatments for either the ringworm or bug bite. The patient denies fevers, chills, or body aches.  The following sections of the patient's history were reviewed and updated as appropriate: allergies, current medications, past family history, past medical history, past social history, past surgical history, and problem list.     Past Medical History:  Diagnosis Date   Adjustment disorder with mixed disturbance of emotions and conduct    Anxiety    Attention deficit hyperactivity disorder (ADHD), combined type    Autism    Disruptive mood dysregulation disorder    History of Aggressive behavior     Patient Active Problem List   Diagnosis Date Noted   Autism spectrum disorder 06/15/2023   Suicidal ideation 11/15/2022   Encounter for annual general medical examination without abnormal findings in adult 10/29/2021   History of Aggressive behavior    Attention deficit hyperactivity disorder (ADHD), combined type    Adjustment disorder with mixed disturbance of emotions and conduct    Overweight 07/15/2020   Polycythemia 07/15/2020   Mild cognitive impairment 07/13/2020    Psychiatric disorder 07/13/2020    History reviewed. No pertinent surgical history.     Home Medications    Prior to Admission medications   Medication Sig Start Date End Date Taking? Authorizing Provider  clotrimazole (LOTRIMIN) 1 % cream Apply to affected area 2 times daily for up to 2 weeks 04/06/24  Yes Charna Neeb, Lucie, FNP  loratadine  (CLARITIN ) 10 MG tablet Take 10 mg by mouth daily. 12/29/23  Yes [provider]  atomoxetine  (STRATTERA ) 60 MG capsule Take 60 mg by mouth daily.    [provider]  busPIRone  (BUSPAR ) 15 MG tablet Take 15 mg by mouth 3 (three) times daily. 07/31/21   [provider]  cetirizine  (ZYRTEC ) 10 MG tablet Take 1 tablet (10 mg total) by mouth daily. Please make appointment for further refills. 06/26/23   Delores Suzann HERO, MD  chlorproMAZINE  (THORAZINE ) 25 MG tablet Take 3 tablets (75 mg total) by mouth at bedtime. Patient taking differently: Take 25 mg by mouth 2 (two) times daily. 11/11/22   Carrion-Carrero, Marlo, MD  Cholecalciferol  (GNP VITAMIN D3 EXTRA STRENGTH) 25 MCG (1000 UT) tablet Take 1 tablet (1,000 Units total) by mouth daily. 01/27/24   Lafe Domino, DO  docusate sodium  (COLACE) 100 MG capsule Take 1 capsule (100 mg total) by mouth daily. 01/27/24   Lafe Domino, DO  fluticasone  (FLONASE ) 50 MCG/ACT nasal spray Place 2 sprays into both nostrils daily as needed for allergies or rhinitis. 01/27/24   Lafe Domino, DO  fluvoxaMINE  (LUVOX ) 100 MG tablet  Take 100 mg by mouth 2 (two) times daily.    [provider]  hydrOXYzine  (ATARAX ) 50 MG tablet Take 100 mg by mouth every morning. Take an additional tablet three times daily as needed for anxiety, panic, agitation and/or sleep 06/04/23   [provider]  melatonin 3 MG TABS tablet Take 1 tablet (3 mg total) by mouth at bedtime. 01/27/24   Lafe Domino, DO    Family History Family History  Adopted: Yes    Social History Social History   Tobacco Use    Smoking status: Never    Passive exposure: Never   Smokeless tobacco: Never  Vaping Use   Vaping status: Never Used  Substance Use Topics   Alcohol use: Never   Drug use: Never     Allergies   Patient has no known allergies.   Review of Systems Review of Systems  Constitutional:  Negative for chills and fever.  Musculoskeletal:  Negative for myalgias.  Skin:  Positive for wound.  All other systems reviewed and are negative.    Physical Exam Triage Vital Signs ED Triage Vitals  Encounter Vitals Group     BP 04/06/24 1755 122/77     Girls Systolic BP Percentile --      Girls Diastolic BP Percentile --      Boys Systolic BP Percentile --      Boys Diastolic BP Percentile --      Pulse Rate 04/06/24 1755 84     Resp 04/06/24 1755 18     Temp 04/06/24 1755 98 F (36.7 C)     Temp Source 04/06/24 1755 Oral     SpO2 04/06/24 1755 97 %     Weight 04/06/24 1754 244 lb 7.8 oz (110.9 kg)     Height --      Head Circumference --      Peak Flow --      Pain Score 04/06/24 1754 0     Pain Loc --      Pain Education --      Exclude from Growth Chart --    No data found.  Updated Vital Signs BP 122/77 (BP Location: Left Arm)   Pulse 84   Temp 98 F (36.7 C) (Oral)   Resp 18   Wt 244 lb 7.8 oz (110.9 kg)   SpO2 97%   BMI 30.56 kg/m   Visual Acuity Right Eye Distance:   Left Eye Distance:   Bilateral Distance:    Right Eye Near:   Left Eye Near:    Bilateral Near:     Physical Exam Vitals reviewed.  Constitutional:      General: He is awake. He is not in acute distress.    Appearance: Normal appearance. He is well-developed. He is not ill-appearing, toxic-appearing or diaphoretic.  HENT:     Head: Normocephalic.     Right Ear: Hearing normal.     Left Ear: Hearing normal.     Nose: Nose normal.     Mouth/Throat:     Mouth: Mucous membranes are moist.  Eyes:     General: Vision grossly intact.     Conjunctiva/sclera: Conjunctivae normal.   Cardiovascular:     Rate and Rhythm: Normal rate and regular rhythm.     Heart sounds: Normal heart sounds.  Pulmonary:     Effort: Pulmonary effort is normal.     Breath sounds: Normal breath sounds and air entry.  Musculoskeletal:  General: Normal range of motion.     Cervical back: Normal range of motion and neck supple.  Skin:    General: Skin is warm and dry.     Findings: Rash and wound present.  Neurological:     General: No focal deficit present.     Mental Status: He is alert and oriented to person, place, and time.  Psychiatric:        Speech: Speech normal.        Behavior: Behavior is cooperative.      #1 - left elbow     #2 - right elbow area     #3 - right upper arm     #4 - left wrist   UC Treatments / Results  Labs (all labs ordered are listed, but only abnormal results are displayed) Labs Reviewed - No data to display  EKG   Radiology No results found.  Procedures Procedures (including critical care time)  Medications Ordered in UC Medications - No data to display  Initial Impression / Assessment and Plan / UC Course  I have reviewed the triage vital signs and the nursing notes.  Pertinent labs & imaging results that were available during my care of the patient were reviewed by me and considered in my medical decision making (see chart for details).     The patient presents with a rash consistent with tinea corporis. Clotrimazole cream was prescribed to apply twice daily for up to two weeks. He also has several insect bites noted on exam, which show no evidence of secondary infection or significant inflammation. Supportive care measures were reviewed for symptom relief. The patient was advised to monitor for signs of worsening rash, spreading infection, or increasing redness and swelling, and to follow up with primary care if symptoms do not improve.  Today's evaluation has revealed no signs of a dangerous process. Discussed  diagnosis with patient and/or guardian. Patient and/or guardian aware of their diagnosis, possible red flag symptoms to watch out for and need for close follow up. Patient and/or guardian understands verbal and written discharge instructions. Patient and/or guardian comfortable with plan and disposition.  Patient and/or guardian has a clear mental status at this time, good insight into illness (after discussion and teaching) and has clear judgment to make decisions regarding their care  Documentation was completed with the aid of voice recognition software. Transcription may contain typographical errors.  Final Clinical Impressions(s) / UC Diagnoses   Final diagnoses:  Tinea corporis  Insect bites and stings, initial encounter     Discharge Instructions      You were seen today for a skin rash. The exam shows that the rash is caused by a fungal infection called ringworm (tinea corporis). You were prescribed clotrimazole cream to apply to the rash twice a day for up to two weeks. Be sure to keep the area clean and dry, and continue using the cream until the rash is completely gone, even if it starts to look better before then. You also have some insect bites, but these do not look infected or inflamed. Supportive care such as using a cool compress, calamine lotion, or an over-the-counter anti-itch cream can help with the itching. Try not to scratch the area to avoid irritation or infection. Please follow up with your doctor if the rash or bites do not improve, or if you notice worsening redness, swelling, spreading rash, pus, or fever.     ED Prescriptions     Medication Sig Dispense  Auth. Provider   clotrimazole (LOTRIMIN) 1 % cream Apply to affected area 2 times daily for up to 2 weeks 28 g Iola Lukes, FNP      PDMP not reviewed this encounter.   Iola Lukes, [place] 04/06/24 2019

## 2024-04-06 NOTE — Discharge Instructions (Addendum)
 You were seen today for a skin rash. The exam shows that the rash is caused by a fungal infection called ringworm (tinea corporis). You were prescribed clotrimazole cream to apply to the rash twice a day for up to two weeks. Be sure to keep the area clean and dry, and continue using the cream until the rash is completely gone, even if it starts to look better before then. You also have some insect bites, but these do not look infected or inflamed. Supportive care such as using a cool compress, calamine lotion, or an over-the-counter anti-itch cream can help with the itching. Try not to scratch the area to avoid irritation or infection. Please follow up with your doctor if the rash or bites do not improve, or if you notice worsening redness, swelling, spreading rash, pus, or fever.

## 2024-04-06 NOTE — ED Triage Notes (Signed)
 Pt presents with Ray Carter, caregiver of the pt. Pt is c/o ringworm on right inner arm and left wrist x 4 days. Pt also c/o possible bug bite on left elbow x 2 days. Pt reports they have not tried anything OTC yet.

## 2024-04-17 ENCOUNTER — Ambulatory Visit (HOSPITAL_COMMUNITY)
Admission: EM | Admit: 2024-04-17 | Discharge: 2024-04-17 | Disposition: A | Discharge status: Home / Self Care | Payer: MEDICAID

## 2024-04-17 ENCOUNTER — Ambulatory Visit (HOSPITAL_COMMUNITY)
Admission: EM | Admit: 2024-04-17 | Discharge: 2024-04-17 | Disposition: A | Discharge status: Home / Self Care | Payer: MEDICAID | Attending: Behavioral Health | Admitting: Behavioral Health

## 2024-04-17 DIAGNOSIS — Z593 Problems related to living in residential institution: Secondary | ICD-10-CM | POA: Insufficient documentation

## 2024-04-17 DIAGNOSIS — G3184 Mild cognitive impairment, so stated: Secondary | ICD-10-CM | POA: Insufficient documentation

## 2024-04-17 DIAGNOSIS — R45851 Suicidal ideations: Secondary | ICD-10-CM | POA: Insufficient documentation

## 2024-04-17 DIAGNOSIS — F840 Autistic disorder: Secondary | ICD-10-CM | POA: Insufficient documentation

## 2024-04-17 DIAGNOSIS — F430 Acute stress reaction: Secondary | ICD-10-CM | POA: Insufficient documentation

## 2024-04-17 DIAGNOSIS — F909 Attention-deficit hyperactivity disorder, unspecified type: Secondary | ICD-10-CM | POA: Insufficient documentation

## 2024-04-17 NOTE — ED Provider Notes (Signed)
 [facility] Medical Screening Exam  Patient Name: Moises Terpstra MRN: [id number] Date of Evaluation: 04/18/24 Chief Complaint:  suicidal ideation Diagnosis:  Final diagnoses:  Mild cognitive impairment  Autism spectrum disorder   History of Present illness: Carsyn Taubman is a 23 y.o. male patient with a psychiatric history significant for mild cognitive impairment, autism spectrum disorder, MDD, ADHD and ODD presented to [facility] voluntarily unaccompanied via [employer] with complaints of passive suicidal ideation. Patient lives in a group home and attends a day treatment program.   Patient presented to [facility] with similar presentation about two hours ago.  Patient resides at a group home and states that he called 911 because he was having suicidal thoughts because he broke up with his girlfriend.  Patient states that he and the girlfriend have only known each other for 8 days.  Patient states that he has been lying about the age of the girlfriend since she is 8 but she is really 32 years old. NP spoke with group home manager [facility] [phone] who stated that she does not feels patient is a danger to himself or anyone else and he can return to the group home.   During evaluation Kamaury Cutbirth is sitting in the assessment room in no acute distress. He is alert, oriented x 3, calm, cooperative and attentive. His mood is euthymic with congruent affect. He has normal speech, and behavior.  Objectively there is no evidence of psychosis/mania or delusional thinking.  Patient is able to converse coherently, goal directed thoughts, no distractibility, or pre-occupation. He also denies suicidal/self-harm/homicidal ideation, psychosis, and paranoia.  Patient answered question appropriately. Patient stated that he was ready to return to the group home.  Patient denies any substance abuse.    Patient can be discharged with resources and follow up care in outpatient services for Medication  Management and Individual Therapy.                 Flowsheet Row ED from 04/17/2024 in [facility] Most recent reading at 04/17/2024  7:04 PM ED from 04/17/2024 in [facility] Most recent reading at 04/17/2024  4:20 PM UC from 04/06/2024 in [facility] at [facility] Most recent reading at 04/06/2024  5:55 PM  C-SSRS RISK CATEGORY High Risk High Risk No Risk    Psychiatric Specialty Exam  Presentation  General Appearance:Casual  Eye Contact:Fair  Speech:Clear and Coherent  Speech Volume:Normal  Handedness:Right   Mood and Affect  Mood: Euthymic  Affect: Appropriate   Thought Process  Thought Processes: Coherent  Descriptions of Associations:Intact  Orientation:Full (Time, Place and Person)  Thought Content:WDL  Diagnosis of Schizophrenia or Schizoaffective disorder in past: No   Hallucinations:None  Ideas of Reference:None  Suicidal Thoughts:Yes, Passive Without Intent; Without Plan  Homicidal Thoughts:No   Sensorium  Memory: Immediate Fair; Recent Fair; Remote Fair  Judgment: Poor  Insight: Shallow   Executive Functions  Concentration: Fair  Attention Span: Fair  Recall: [employer] of Knowledge: Fair  Language: Fair   Psychomotor Activity  Psychomotor Activity: Normal   Assets  Assets: Housing; Resilience; Physical Health   Sleep  Sleep: Good  Number of hours:  10   Physical Exam: Physical Exam HENT:     Head: Normocephalic.     Nose: Nose normal.  Eyes:     Pupils: Pupils are equal, round, and reactive to light.  Cardiovascular:     Rate  and Rhythm: Normal rate.  Pulmonary:     Effort: Pulmonary effort is normal.  Abdominal:     General: Abdomen is flat.  Musculoskeletal:        General: Normal range of motion.     Cervical back: Normal range of motion.  Skin:    General: Skin is warm.  Neurological:     Mental  Status: He is alert and oriented to person, place, and time.  Psychiatric:        Attention and Perception: Attention normal.        Mood and Affect: Mood normal.        Speech: Speech normal.        Behavior: Behavior is cooperative.        Thought Content: Thought content is not paranoid or delusional. Thought content does not include homicidal or suicidal ideation. Thought content does not include homicidal or suicidal plan.        Cognition and Memory: Cognition normal.        Judgment: Judgment is impulsive.    Review of Systems  Constitutional: Negative.   HENT: Negative.    Eyes: Negative.   Respiratory: Negative.    Cardiovascular: Negative.   Gastrointestinal: Negative.   Genitourinary: Negative.   Musculoskeletal: Negative.   Skin: Negative.   Psychiatric/Behavioral:  The patient is nervous/anxious.    Blood pressure 118/88, pulse (!) 114, temperature 98 F (36.7 C), temperature source Oral, resp. rate 18, SpO2 97%. There is no height or weight on file to calculate BMI.  Musculoskeletal: Strength & Muscle Tone: within normal limits Gait & Station: normal Patient leans: N/A   [facility] MSE Discharge Disposition for Follow up and Recommendations: Based on my evaluation the patient does not appear to have an emergency medical condition and can be discharged with resources and follow up care in outpatient services for Medication Management and Individual Therapy   Roxianne FORBES Olp, NP 04/18/2024, 12:29 AM

## 2024-04-17 NOTE — Discharge Instructions (Signed)

## 2024-04-17 NOTE — Discharge Instructions (Addendum)
 Discharge recommendations:   Medications: Patient is to take medications as prescribed. No medication changes were made during your stay. The patient or patient's guardian is to contact a medical professional and/or outpatient provider to address any new side effects that develop. The patient or the patient's guardian should update outpatient providers of any new medications and/or medication changes.   Outpatient Follow up: Please follow up with your outpatient providers for medication management and therapy. Please follow up with your primary care provider for all medical related needs.   Therapy: We recommend that patient participate in individual therapy to address mental health concerns.  Atypical antipsychotics: If you are prescribed an atypical antipsychotic, it is recommended that your height, weight, BMI, blood pressure, fasting lipid panel, and fasting blood sugar be monitored by your outpatient providers.  Safety:   The following safety precautions should be taken:   No sharp objects. This includes scissors, razors, scrapers, and putty knives.   Chemicals should be removed and locked up.   Medications should be removed and locked up.   Weapons should be removed and locked up. This includes firearms, knives and instruments that can be used to cause injury.   The patient should abstain from use of illicit substances/drugs and abuse of any medications.  If symptoms worsen or do not continue to improve or if the patient becomes actively suicidal or homicidal then it is recommended that the patient return to the closest hospital emergency department, the [facility], or call 911 for further evaluation and treatment. National Suicide Prevention Lifeline 1-800-SUICIDE or (973)027-8443.  About 988 988 offers 24/7 access to trained crisis counselors who can help people experiencing mental health-related distress. People can call or text 988 or chat  988lifeline.org for themselves or if they are worried about a loved one who may need crisis support.
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
## 2024-04-17 NOTE — ED Notes (Signed)
 Patient discharged back to group home per provider order. Patient belongings returned complete and intact. AVS reviewed via staff, patient escorted to back sallyport for transport to their desination. Safety maintained.

## 2024-04-17 NOTE — ED Provider Notes (Signed)
 Behavioral Health Urgent Care Medical Screening Exam  Patient Name: Ray Carter MRN: [id number] Date of Evaluation: 04/17/24 Chief Complaint:  Per Pt Ray Carter is a 87Y male presenting [facility] vol via [employer]. Pt states that he just wants to talk to someone. Pt states that he is feeling suicidal due to a conversation/argument that he had with his girlfriend and his friends. Pt denies having a plan. Pt has a hx of MDD, IDD, ODD, Austism, ADHD, and ADD. Pt denies HI, AVH, and substance use. Diagnosis:  Final diagnoses:  Mild cognitive impairment  Autism spectrum disorder  Stress reaction    History of Present illness: Ray Carter is a 23 y.o. male patient with a documented past psychiatric history significant for mild cognitive impairment, ADHD, adjustment disorder, aggressive behaviors, and autism who presented to the [facility] voluntary accompanied by law enforcement with complaints of SI with no plan or intent. Patient states that he wanted to talk to someone after he got into an argument with some friends about a girl (friend). He states that another boy was arguing with him and the conversation went downhill. He states that he was sitting on the porch listening to slow music and decided to call the police because he was feeling suicidal, wishing I was dead. He denies active suicidal thoughts. He denies homicidal thoughts. He denies auditory or visual hallucinations. Objectively, no signs of acute psychosis. He is unable to describe his mood and states, I do not know. He then states that he has been feeling frustrated because he doesn't want to live at the group home. He states that he's no longer allowed to mow the grass, wash cars, go to the day support staff house to feed the chickens and she is not allowed to give him money anymore. He states that he wants to move from the group home because he does not like the area. He states that he's moving to a new group  home in 30-90 days. He states that he was told that if he goes to the hospital again and stay then he won't be able to return back to the group home. He asked if he could stay here at this facility.   On evaluation, patient is alert and oriented x 4. Thought process is linear. Thought content is negative for active SI/HI/AVH. Objectively, no signs of acute psychosis. Speech is clear and coherent. Mood is euthymic and affect is appropriate. He is noted to be laughing, and smiling on exam. He has fair eye contact. He is well-groomed and is casually dressed. He is calm and cooperative and does not appear to be in acute distress and does not exhibit any aggressive behaviors.  I spoke to [facility] [phone]. Mylinda states that the patient called the police because he was upset and said he was feeling heartbroken. She states that whenever he is upset and is not having a good day he thinks he is supposed to call the police and go to the hospital. She states that he sees going to the hospital as an outing. She states that she is trying to figure out a plan so that he does not call the police or go to the hospital every time he is upset or having a good day. She states that he also have attention seeking and manipulative behaviors. Mylinda states that the patient has wrap around services, care coordinator, therapy and psychiatry. She denies safety concerns with the patient returning home this evening.  She asked if the patient can be transported back home by law enforcement because the staff members at the group home are not able to leave with current resident and she is on her way back into town. Agreeable, to transport patient back to the group home via Patent examiner.   Psychiatric Specialty Exam  Presentation  General Appearance:Appropriate for Environment  Eye Contact:Fair  Speech:Clear and Coherent  Speech Volume:Normal  Handedness:Right   Mood and Affect   Mood: Euthymic  Affect: Appropriate   Thought Process  Thought Processes: Coherent; Linear  Descriptions of Associations:Intact  Orientation:Full (Time, Place and Person)  Thought Content:Logical  Diagnosis of Schizophrenia or Schizoaffective disorder in past: No   Hallucinations:None  Ideas of Reference:None  Suicidal Thoughts:No  Homicidal Thoughts:No   Sensorium  Memory: Immediate Fair; Recent Fair  Judgment: Intact  Insight: Shallow   Executive Functions  Concentration: Fair  Attention Span: Fair  Recall: [employer] of Knowledge: Fair  Language: Fair   Psychomotor Activity  Psychomotor Activity: Normal   Assets  Assets: Manufacturing systems engineer; Desire for Improvement; Financial Resources/Insurance; Housing; Social Support   Sleep  Sleep: Fair   Physical Exam: Physical Exam Cardiovascular:     Rate and Rhythm: Normal rate.  Pulmonary:     Effort: Pulmonary effort is normal.  Musculoskeletal:        General: Normal range of motion.     Cervical back: Normal range of motion.  Neurological:     Mental Status: He is alert and oriented to person, place, and time.    Review of Systems  Constitutional: Negative.   HENT: Negative.    Eyes: Negative.   Respiratory: Negative.    Cardiovascular: Negative.   Gastrointestinal: Negative.   Genitourinary: Negative.   Musculoskeletal: Negative.   Neurological: Negative.   Endo/Heme/Allergies: Negative.    Blood pressure (!) 115/93, pulse (!) 103, temperature 98 F (36.7 C), temperature source Oral, SpO2 97%. There is no height or weight on file to calculate BMI.  Musculoskeletal: Strength & Muscle Tone: within normal limits Gait & Station: normal Patient leans: N/A   [facility] MSE Discharge Disposition for Follow up and Recommendations: Based on my evaluation the patient does not appear to have an emergency medical condition and can be discharged with resources and follow up care in  outpatient services for Medication Management and Individual Therapy Discharge recommendations:   Medications: Patient is to take medications as prescribed. No medication changes were made during your stay. The patient or patient's guardian is to contact a medical professional and/or outpatient provider to address any new side effects that develop. The patient or the patient's guardian should update outpatient providers of any new medications and/or medication changes.   Outpatient Follow up: Please follow up with your outpatient providers for medication management and therapy. Please follow up with your primary care provider for all medical related needs.   Therapy: We recommend that patient participate in individual therapy to address mental health concerns.  Atypical antipsychotics: If you are prescribed an atypical antipsychotic, it is recommended that your height, weight, BMI, blood pressure, fasting lipid panel, and fasting blood sugar be monitored by your outpatient providers.  Safety:   The following safety precautions should be taken:   No sharp objects. This includes scissors, razors, scrapers, and putty knives.   Chemicals should be removed and locked up.   Medications should be removed and locked up.   Weapons should be removed and locked up. This includes firearms, knives and  instruments that can be used to cause injury.   The patient should abstain from use of illicit substances/drugs and abuse of any medications.  If symptoms worsen or do not continue to improve or if the patient becomes actively suicidal or homicidal then it is recommended that the patient return to the closest hospital emergency department, the [facility], or call 911 for further evaluation and treatment. National Suicide Prevention Lifeline 1-800-SUICIDE or (539)281-1559.  About 988 988 offers 24/7 access to trained crisis counselors who can help people experiencing mental  health-related distress. People can call or text 988 or chat 988lifeline.org for themselves or if they are worried about a loved one who may need crisis support.    Teresa Wyline CROME, NP 04/17/2024, 4:47 PM

## 2024-05-20 ENCOUNTER — Other Ambulatory Visit (HOSPITAL_COMMUNITY): Payer: Self-pay

## 2024-05-20 ENCOUNTER — Encounter: Payer: Self-pay | Admitting: Family Medicine

## 2024-05-20 ENCOUNTER — Telehealth: Payer: Self-pay

## 2024-05-20 ENCOUNTER — Ambulatory Visit (INDEPENDENT_AMBULATORY_CARE_PROVIDER_SITE_OTHER): Payer: MEDICAID | Admitting: Family Medicine

## 2024-05-20 VITALS — BP 106/74 | HR 98 | Wt 247.0 lb

## 2024-05-20 DIAGNOSIS — D234 Other benign neoplasm of skin of scalp and neck: Secondary | ICD-10-CM | POA: Diagnosis not present

## 2024-05-20 DIAGNOSIS — M545 Low back pain, unspecified: Secondary | ICD-10-CM | POA: Diagnosis not present

## 2024-05-20 DIAGNOSIS — M21611 Bunion of right foot: Secondary | ICD-10-CM

## 2024-05-20 DIAGNOSIS — R040 Epistaxis: Secondary | ICD-10-CM

## 2024-05-20 MED ORDER — LIDOCAINE 5 % EX PTCH: 1.0000 | MEDICATED_PATCH | CUTANEOUS | 0 refills | Status: DC

## 2024-05-20 MED ORDER — ACETAMINOPHEN ER 650 MG PO TBCR: 650.0000 mg | EXTENDED_RELEASE_TABLET | Freq: Three times a day (TID) | ORAL | 0 refills | Status: DC | PRN

## 2024-05-20 MED ORDER — AYR SALINE NASAL NA GEL: 1.0000 | Freq: Two times a day (BID) | NASAL | 3 refills | Status: DC

## 2024-05-20 NOTE — Progress Notes (Signed)
    SUBJECTIVE:  Accompanied by group home caregiver who aids in history.  CHIEF COMPLAINT / HPI:   Lower back pain Comes and goes, but caregiver says he has not mentioned it often. Goes away when laying down. No numbness/tingling in legs. No change in bowel/bladder function. Never used any OTC meds for discomfort.  Nose bleeds Heavy in the last 2-4 months. Several times a week, bleeds last 10-30 minutes. Denies nose picking. In the past he says Flonase  has caused nosebleeds, but he has not been using this lately. Caregiver notes nosebleeds occur with exertion.   Bunion-right foot Easily evaluated at [facility] in [place], [place]. He reports it is painful.  He wears cowboy boots most frequently which caregiver notes do not have much support.  He has been recommended to wear more supportive shoes in the past.  Has tried some shoe inserts.  Bumps on scalp He noticed these recently, is not sure how long they have been there.  1 bump in the front of the scalp close to the hairline, 2 other bumps each on opposite side of head.  Not painful.  Not getting larger since he noticed them.  PERTINENT  PMH / PSH: Reviewed. ADHD, ASD, anxiety, aggression (resides in group home for past 4 years).  OBJECTIVE:   BP 106/74   Pulse 98   Wt 247 lb (112 kg)   SpO2 98%   BMI 30.87 kg/m   General: Well-appearing, no acute distress. HEENT: 3 small Doximity 2 cm nodules (anterior hairline, and one each over R and L side of scalp). Otherwise normocephalic, PERRLA, EOM grossly intact, MMM. Left nares with scabbing and dried blood, otherwise bilateral nares patent and tissues healthy appearing. Cardio: Regular rate, regular rhythm, no murmurs on exam. Pulm: Clear, no wheezing, no crackles. No increased work of breathing. Abdominal: soft, non-tender, non-distended. Back: no evidence of scoliosis, no bony step-offs. Bilateral muscular hypertonicity noted in low back with some mild sensitivity to  palpation. Extremities: No peripheral edema. Moves all extremities equally. Neuro: CN II: PERRL CN III, IV,VI: EOMI CN VIII: Normal hearing CN IX,X: Symmetric palate raise  CN XII: Symmetric tongue protrusion  UE and LE strength 5/5 Normal sensation in UE and LE bilaterally   ASSESSMENT/PLAN:   Assessment & Plan Frequent nosebleeds Likely related to change in weather, drier air. Prominent superficial vessels in left nares are likely the source of bleeding. Shared decision making and discussed management options. - patient will use Ayr nasal saline gel BID for 1-2 weeks, then PRN - strict return precautions given for bleeds lasting longer than 5 minutes - consider ENT referral in future Low back pain without sciatica, unspecified back pain laterality, unspecified chronicity The nature of patient's back pain is more consistent with musculoskeletal pain.  No red flag symptoms, denies incontinence or saddle paresthesia. Exam notable for area of right-sided paraspinal tenderness at L3. Patient has 5/5 muscle strength on all extremities with sensation intact.  -Recommend use of lidocaine  patch -Rx Tylenol  650 mg every 8 hours as needed. Bunion of right foot Likely related to footwear.  - referral placed to podiatry Dermoid cyst of scalp Appearance and location consistent with dermoid cyst. No drainage or inflammation suggestive of infection. As they are not bothersome would not recommend removal at this time. - advised not to manipulate or pick at - may consider removal in future if they become bothersome.    Lauraine Norse, DO [facility]

## 2024-05-20 NOTE — Patient Instructions (Addendum)
 Nosebleeds -Try using Ayr nasal gel twice a day for at least 1 week.  After that you can start using as needed. -If you have nosebleeds that last longer than 5-10 minutes you should go to urgent care or the emergency room. -If nosebleeds continue you could consider nasal cautery at ENT.  I can refer you to for this if you would like.  Back pain -I recommend using lidocaine  patch daily as needed to the painful area on your back. - You can also take Tylenol  650 mg up to 3 times a day as needed for pain.  Foot pain -I have referred you to podiatry for your bunion.  Please take your shoes that you planned to wear with you to this appointment. - I recommend wearing your more supportive shoes daily.  Bumps on head - These are small cysts, they are not dangerous. - Try not to pick at them as that can make them get larger. - If they do get larger or cause you discomfort in the future, you can always let us  know and we can talk about removing them.

## 2024-05-20 NOTE — Telephone Encounter (Signed)
 Pharmacy Patient Advocate Encounter   Received notification from Patient Pharmacy that prior authorization for LIDOCAINE  5% PATCHES is required/requested.   Insurance verification completed.   The patient is insured through [facility] MEDICAID.   PA required; PA started via CoverMyMeds. KEY [id number] . Waiting for clinical questions to populate.

## 2024-05-23 NOTE — Telephone Encounter (Signed)
 Prior authorization submitted for LIDOCAINE  5% PATCHES to [employer]ICA[employer] via Latent.   Key: [id number]

## 2024-05-24 NOTE — Telephone Encounter (Signed)
 Pharmacy Patient Advocate Encounter  Received notification from [employer]  MEDICAID that Prior Authorization for LIDOCAINE  5% PATCHES has been APPROVED from 05/23/24 to 05/23/25   PA #/Case ID/Reference #: [id number]

## 2024-06-07 ENCOUNTER — Other Ambulatory Visit: Payer: Self-pay

## 2024-06-07 ENCOUNTER — Encounter: Payer: Self-pay | Admitting: Emergency Medicine

## 2024-06-07 ENCOUNTER — Ambulatory Visit
Admission: EM | Admit: 2024-06-07 | Discharge: 2024-06-07 | Disposition: A | Discharge status: Home / Self Care | Payer: MEDICAID

## 2024-06-07 DIAGNOSIS — J069 Acute upper respiratory infection, unspecified: Secondary | ICD-10-CM

## 2024-06-07 NOTE — Discharge Instructions (Signed)

## 2024-06-07 NOTE — ED Triage Notes (Signed)
 Pt here for nasal congestion and cough x 3 days; pt here with caregiver

## 2024-06-07 NOTE — ED Provider Notes (Signed)
 [facility]    CSN: [id number] Arrival date & time: 06/07/24  1503      History   Chief Complaint Chief Complaint  Patient presents with   Nasal Congestion    HPI Ray Carter is a 23 y.o. male.   Pt presents today due to 3 days of nasal congestion and cough. Pt denies fever, known sick contacts, or changes in appetite. Pt states that he has been using Robitussin and Vitamin C for symptoms with no significant relief.  The history is provided by the patient.    Past Medical History:  Diagnosis Date   Adjustment disorder with mixed disturbance of emotions and conduct    Anxiety    Attention deficit hyperactivity disorder (ADHD), combined type    Autism    Disruptive mood dysregulation disorder    History of Aggressive behavior     Patient Active Problem List   Diagnosis Date Noted   Autism spectrum disorder 06/15/2023   Suicidal ideation 11/15/2022   Encounter for annual general medical examination without abnormal findings in adult 10/29/2021   History of Aggressive behavior    Attention deficit hyperactivity disorder (ADHD), combined type    Adjustment disorder with mixed disturbance of emotions and conduct    Overweight 07/15/2020   Polycythemia 07/15/2020   Mild cognitive impairment 07/13/2020   Psychiatric disorder 07/13/2020    History reviewed. No pertinent surgical history.     Home Medications    Prior to Admission medications   Medication Sig Start Date End Date Taking? Authorizing Provider  acetaminophen  (TYLENOL  8 HOUR) 650 MG CR tablet Take 1 tablet (650 mg total) by mouth every 8 (eight) hours as needed for pain. 05/20/24   Lafe Domino, DO  atomoxetine  (STRATTERA ) 60 MG capsule Take 60 mg by mouth daily.    [provider]  busPIRone  (BUSPAR ) 15 MG tablet Take 15 mg by mouth 3 (three) times daily. 07/31/21   [provider]  cetirizine  (ZYRTEC ) 10 MG tablet Take 1 tablet (10 mg total) by mouth daily. Please make  appointment for further refills. 06/26/23   Delores Suzann HERO, MD  chlorproMAZINE  (THORAZINE ) 25 MG tablet Take 3 tablets (75 mg total) by mouth at bedtime. Patient taking differently: Take 25 mg by mouth 2 (two) times daily. 11/11/22   Carrion-Carrero, Marlo, MD  Cholecalciferol  (GNP VITAMIN D3 EXTRA STRENGTH) 25 MCG (1000 UT) tablet Take 1 tablet (1,000 Units total) by mouth daily. 01/27/24   Lafe Domino, DO  clotrimazole  (LOTRIMIN ) 1 % cream Apply to affected area 2 times daily for up to 2 weeks 04/06/24   Iola Lukes, FNP  docusate sodium  (COLACE) 100 MG capsule Take 1 capsule (100 mg total) by mouth daily. 01/27/24   Lafe Domino, DO  fluticasone  (FLONASE ) 50 MCG/ACT nasal spray Place 2 sprays into both nostrils daily as needed for allergies or rhinitis. 01/27/24   Lafe Domino, DO  fluvoxaMINE  (LUVOX ) 100 MG tablet Take 100 mg by mouth 2 (two) times daily.    [provider]  hydrOXYzine  (ATARAX ) 50 MG tablet Take 100 mg by mouth every morning. Take an additional tablet three times daily as needed for anxiety, panic, agitation and/or sleep 06/04/23   [provider]  lidocaine  (LIDODERM ) 5 % Place 1 patch onto the skin daily. Remove & Discard patch within 12 hours or as directed by MD. Use for low back pain. 05/20/24   Lafe Domino, DO  loratadine  (CLARITIN ) 10 MG tablet Take 10 mg by mouth daily.  12/29/23   [provider]  melatonin 3 MG TABS tablet Take 1 tablet (3 mg total) by mouth at bedtime. 01/27/24   Lafe Domino, DO  saline (AYR) GEL Place 1 Application into both nostrils 2 (two) times daily. 05/20/24   Lafe Domino, DO    Family History Family History  Adopted: Yes    Social History Social History   Tobacco Use   Smoking status: Never    Passive exposure: Never   Smokeless tobacco: Never  Vaping Use   Vaping status: Never Used  Substance Use Topics   Alcohol use: Never   Drug use: Never     Allergies   Patient has no known  allergies.   Review of Systems Review of Systems   Physical Exam Triage Vital Signs ED Triage Vitals [06/07/24 1535]  Encounter Vitals Group     BP 111/71     Girls Systolic BP Percentile      Girls Diastolic BP Percentile      Boys Systolic BP Percentile      Boys Diastolic BP Percentile      Pulse Rate 100     Resp 18     Temp 98.1 F (36.7 C)     Temp Source Oral     SpO2 96 %     Weight      Height      Head Circumference      Peak Flow      Pain Score 0     Pain Loc      Pain Education      Exclude from Growth Chart    No data found.  Updated Vital Signs BP 111/71 (BP Location: Left Arm)   Pulse 100   Temp 98.1 F (36.7 C) (Oral)   Resp 18   SpO2 96%   Visual Acuity Right Eye Distance:   Left Eye Distance:   Bilateral Distance:    Right Eye Near:   Left Eye Near:    Bilateral Near:     Physical Exam Vitals and nursing note reviewed.  Constitutional:      General: He is not in acute distress.    Appearance: Normal appearance. He is not ill-appearing, toxic-appearing or diaphoretic.  HENT:     Nose: Congestion (moderately enlarged turbinates) present. No rhinorrhea.     Mouth/Throat:     Mouth: Mucous membranes are moist.     Pharynx: Oropharynx is clear. No oropharyngeal exudate or posterior oropharyngeal erythema.  Eyes:     General: No scleral icterus. Cardiovascular:     Rate and Rhythm: Normal rate and regular rhythm.     Heart sounds: Normal heart sounds.  Pulmonary:     Effort: Pulmonary effort is normal. No respiratory distress.     Breath sounds: Normal breath sounds. No wheezing or rhonchi.  Skin:    General: Skin is warm.  Neurological:     Mental Status: He is alert and oriented to person, place, and time.  Psychiatric:        Mood and Affect: Mood normal.        Behavior: Behavior normal.      UC Treatments / Results  Labs (all labs ordered are listed, but only abnormal results are displayed) Labs Reviewed - No data to  display  EKG   Radiology No results found.  Procedures Procedures (including critical care time)  Medications Ordered in UC Medications - No data to display  Initial Impression / Assessment and Plan /  UC Course  I have reviewed the triage vital signs and the nursing notes.  Pertinent labs & imaging results that were available during my care of the patient were reviewed by me and considered in my medical decision making (see chart for details).    Final Clinical Impressions(s) / UC Diagnoses   Final diagnoses:  Viral URI     Discharge Instructions      You been diagnosed with a viral illness today. -Viruses have to run their course and medicines that are prescribed are meant to help with symptoms. - With viruses usually feel poorly from 3 to 7 days with cough being the last symptoms to resolve.  -Cough can linger from days to weeks.  Antibiotics are not effective for viruses. -If your cough lasts more than 2 weeks and you are coughing so hard that you are vomiting or feel like you could pass out we need to follow-up with PCP for further testing and evaluation. -Rest, increase water  intake, may use pseudoephedrine for nasal congestion, Delsym (dextromethorphan) or honey as needed for cough, and ibuprofen and/or Tylenol  as directed on packaging for pain and fever. -If you have hypertension you should take Coricidin or other OTC meds approved for people with high blood pressure. -You may use a spoonful of honey every 4-6 hours as needed for throat pain and cough. -Warm tea with honey and lemon are helpful for soothe throat as well.  Chloraseptic and Cepacol make a throat lozenge with numbing medication, can be purchased over-the-counter. -May also use Flonase  or sinus rinse for sinus pressure or nasal congestion.  Be sure to use distilled bottled water  for sinus rinses. -May use coolmist humidifier to open up nasal passages -May elevate head to assist with postnasal  drainage. -If you feel poorly (fever, fatigue, shortness of breath, nausea, etc.) for more than 10 days to be sure to follow-up with PCP or in clinic for further evaluation and additional treatments. If you experience chest pain with shortness of breath or pulse oxygen less than 95% you should report to the ER.     ED Prescriptions   None    PDMP not reviewed this encounter.   Andra Corean BROCKS, PA-C 06/07/24 1624

## 2024-07-17 ENCOUNTER — Ambulatory Visit (HOSPITAL_COMMUNITY)
Admission: EM | Admit: 2024-07-17 | Discharge: 2024-07-17 | Disposition: A | Discharge status: Home / Self Care | Payer: MEDICAID | Attending: Psychiatry | Admitting: Psychiatry

## 2024-07-17 DIAGNOSIS — F331 Major depressive disorder, recurrent, moderate: Secondary | ICD-10-CM

## 2024-07-17 DIAGNOSIS — Z79899 Other long term (current) drug therapy: Secondary | ICD-10-CM | POA: Insufficient documentation

## 2024-07-17 DIAGNOSIS — Z713 Dietary counseling and surveillance: Secondary | ICD-10-CM | POA: Insufficient documentation

## 2024-07-17 DIAGNOSIS — F840 Autistic disorder: Secondary | ICD-10-CM | POA: Insufficient documentation

## 2024-07-17 DIAGNOSIS — R45850 Homicidal ideations: Secondary | ICD-10-CM | POA: Insufficient documentation

## 2024-07-17 DIAGNOSIS — F79 Unspecified intellectual disabilities: Secondary | ICD-10-CM | POA: Insufficient documentation

## 2024-07-17 DIAGNOSIS — Z593 Problems related to living in residential institution: Secondary | ICD-10-CM | POA: Insufficient documentation

## 2024-07-17 DIAGNOSIS — F809 Developmental disorder of speech and language, unspecified: Secondary | ICD-10-CM | POA: Insufficient documentation

## 2024-07-17 MED ORDER — ATOMOXETINE HCL 60 MG PO CAPS: 60.0000 mg | ORAL_CAPSULE | Freq: Every day | ORAL | Status: AC

## 2024-07-17 MED ORDER — FLUTICASONE PROPIONATE 50 MCG/ACT NA SUSP: 2.0000 | Freq: Every day | NASAL | Status: AC | PRN

## 2024-07-17 MED ORDER — MELATONIN 3 MG PO TABS: 3.0000 mg | ORAL_TABLET | Freq: Every day | ORAL | Status: AC

## 2024-07-17 MED ORDER — CHLORPROMAZINE HCL 25 MG PO TABS: 75.0000 mg | ORAL_TABLET | Freq: Every day | ORAL | Status: AC

## 2024-07-17 MED ORDER — HYDROXYZINE HCL 50 MG PO TABS: 100.0000 mg | ORAL_TABLET | ORAL | Status: AC

## 2024-07-17 MED ORDER — BUSPIRONE HCL 15 MG PO TABS: 15.0000 mg | ORAL_TABLET | Freq: Three times a day (TID) | ORAL | Status: AC

## 2024-07-17 MED ORDER — DOCUSATE SODIUM 100 MG PO CAPS: 100.0000 mg | ORAL_CAPSULE | Freq: Every day | ORAL | Status: AC

## 2024-07-17 MED ORDER — GNP VITAMIN D3 EXTRA STRENGTH 25 MCG (1000 UT) PO TABS: 1000.0000 [IU] | ORAL_TABLET | Freq: Every day | ORAL | Status: AC

## 2024-07-17 MED ORDER — FLUVOXAMINE MALEATE 100 MG PO TABS: 100.0000 mg | ORAL_TABLET | Freq: Two times a day (BID) | ORAL | Status: AC
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
## 2024-07-17 NOTE — Progress Notes (Signed)
" °   07/17/24 1156  BHUC Triage Screening (Walk-ins at [facility] only)  How Did You Hear About Us ? Legal System  What Is the Reason for Your Visit/Call Today? Ray Carter is a 63Y male presenting to [facility] vol via [employer]. Pt states he is here today because he is feeling homocidal towards [name] at his group home. Pt states he was just laying in bed this morning and the thoughts just kept coming. Pt told writer you better not send me home this time. Pt denis SI, AVH, and substance use. Pt has a hx of IDD, MDD, Autism, ODD, and OCD. Pt is taking medication and seeing a therapist.  How Long Has This Been Causing You Problems? > than 6 months (June 2025)  Have You Recently Had Any Thoughts About Hurting Yourself? No  Are You Planning to Commit Suicide/Harm Yourself At This time? No  Have you Recently Had Thoughts About Hurting Someone [name]? Yes  How long ago did you have thoughts of harming others? This morning  Are You Planning To Harm Someone At This Time? Yes  Explanation: [name]  Are you currently experiencing any auditory, visual or other hallucinations? No  Have You Used Any Alcohol or Drugs in the Past 24 Hours? No  Do you have any current medical co-morbidities that require immediate attention? No  What Do You Feel Would Help You the Most Today? Treatment for Depression or other mood problem  Determination of Need Urgent (48 hours)  Options For Referral BH Urgent Care;Inpatient Hospitalization;Outpatient Therapy;Medication Management  Determination of Need filed? Yes    "

## 2024-07-17 NOTE — Discharge Instructions (Addendum)
 Talk to [name] or other trusted person about how you are being treated.  Read information about how to improve your sleep.   Activity: Daily physical activity. More active movement. Make a new fort. Less video games and sitting. Enjoy [facility] tomorrow.   Diet: Portion-limited diet rich in produce, whole (minimally processed) grains, nuts/seeds, eggs, beans/legumes, seafood, lowfat dairy (if tolerated), fermented food, lean meat. Copious water  intake. Limit (ultra)processed foods and sugar-sweetened beverages. Peaches and bananas.    -Follow-up with your outpatient psychiatric provider - [facility]. Talk to your [facility] doctor about moving around medications to help you stay asleep.    -Take your psychiatric medications as prescribed at discharge. Monitor anticholinergic burden (chlorpromazine , hydroxyzine ) which may lead to constipation, confusion, irritability, excessive daytime sedation.   -Follow-up with outpatient primary care doctor to optimize health maintenance/preventative care and other specialists -for management of chronic medical disease.    -If you are prescribed an atypical antipsychotic medication, we recommend that your outpatient psychiatrist follow routine screening for side effects within 3 months of discharge, including monitoring: AIMS scale, height, weight, blood pressure, fasting lipid panel, HbA1c, and fasting blood sugar.    -Recommend total abstinence from alcohol, tobacco, and other illicit drug use at discharge.    -If your psychiatric symptoms recur, worsen, or if you have side effects to your psychiatric medications, call your outpatient psychiatric provider, 911, [phone] or go to the nearest emergency department.   -If suicidal thoughts occur, immediately call your outpatient psychiatric provider, 911, [phone] or go to the nearest emergency department.
